# Patient Record
Sex: Female | Born: 1975 | Race: White | Hispanic: No | Marital: Married | Smoking: Never smoker
Health system: Southern US, Community
[De-identification: ages and names within clinical notes are randomized; demographics above are authoritative.]

## PROBLEM LIST (undated history)

## (undated) ENCOUNTER — Inpatient Hospital Stay (HOSPITAL_COMMUNITY): Payer: Self-pay

## (undated) DIAGNOSIS — R42 Dizziness and giddiness: Secondary | ICD-10-CM

## (undated) DIAGNOSIS — N049 Nephrotic syndrome with unspecified morphologic changes: Secondary | ICD-10-CM

## (undated) HISTORY — DX: Dizziness and giddiness: R42

## (undated) HISTORY — PX: OTHER SURGICAL HISTORY: SHX169

## (undated) HISTORY — DX: Nephrotic syndrome with unspecified morphologic changes: N04.9

## (undated) HISTORY — PX: WISDOM TOOTH EXTRACTION: SHX21

---

## 2004-11-11 ENCOUNTER — Observation Stay (HOSPITAL_COMMUNITY): Admission: AD | Admit: 2004-11-11 | Discharge: 2004-11-12 | Payer: Self-pay | Admitting: *Deleted

## 2004-11-11 ENCOUNTER — Ambulatory Visit: Payer: Self-pay | Admitting: *Deleted

## 2007-05-25 ENCOUNTER — Ambulatory Visit (HOSPITAL_COMMUNITY): Admission: RE | Admit: 2007-05-25 | Discharge: 2007-05-25 | Payer: Self-pay | Admitting: Obstetrics and Gynecology

## 2007-07-11 ENCOUNTER — Inpatient Hospital Stay (HOSPITAL_COMMUNITY): Admission: AD | Admit: 2007-07-11 | Discharge: 2007-07-11 | Payer: Self-pay | Admitting: Obstetrics and Gynecology

## 2007-08-07 ENCOUNTER — Inpatient Hospital Stay (HOSPITAL_COMMUNITY): Admission: AD | Admit: 2007-08-07 | Discharge: 2007-08-10 | Payer: Self-pay | Admitting: Obstetrics and Gynecology

## 2008-07-04 ENCOUNTER — Encounter: Admission: RE | Admit: 2008-07-04 | Discharge: 2008-07-04 | Payer: Self-pay | Admitting: Obstetrics and Gynecology

## 2009-03-14 ENCOUNTER — Inpatient Hospital Stay (HOSPITAL_COMMUNITY): Admission: AD | Admit: 2009-03-14 | Discharge: 2009-03-16 | Payer: Self-pay | Admitting: Obstetrics and Gynecology

## 2009-04-19 ENCOUNTER — Ambulatory Visit: Payer: Self-pay | Admitting: Family Medicine

## 2009-04-19 DIAGNOSIS — N049 Nephrotic syndrome with unspecified morphologic changes: Secondary | ICD-10-CM | POA: Insufficient documentation

## 2009-04-19 HISTORY — DX: Nephrotic syndrome with unspecified morphologic changes: N04.9

## 2009-08-14 ENCOUNTER — Ambulatory Visit: Payer: Self-pay | Admitting: Family Medicine

## 2009-09-13 ENCOUNTER — Ambulatory Visit: Payer: Self-pay | Admitting: Family Medicine

## 2009-09-25 ENCOUNTER — Telehealth: Payer: Self-pay | Admitting: Family Medicine

## 2010-03-07 ENCOUNTER — Telehealth: Payer: Self-pay | Admitting: Family Medicine

## 2010-03-07 ENCOUNTER — Ambulatory Visit: Payer: Self-pay | Admitting: Family Medicine

## 2010-03-07 ENCOUNTER — Encounter: Payer: Self-pay | Admitting: Family Medicine

## 2010-03-07 DIAGNOSIS — R42 Dizziness and giddiness: Secondary | ICD-10-CM

## 2010-03-07 HISTORY — DX: Dizziness and giddiness: R42

## 2010-03-12 ENCOUNTER — Ambulatory Visit: Payer: Self-pay

## 2010-03-12 ENCOUNTER — Telehealth (INDEPENDENT_AMBULATORY_CARE_PROVIDER_SITE_OTHER): Payer: Self-pay | Admitting: *Deleted

## 2010-07-16 NOTE — Progress Notes (Signed)
Summary: Event monitor  Phone Note Outgoing Call Call back at [facility] Phone [phone]   Call placed by: Stanton Kidney, EMT-P,  March 12, 2010 11:10 AM Summary of Call: [facility] for Pt. to call back to schedule for or mail event monitor. Stanton Kidney, EMT-P  March 12, 2010 11:10 AM   Follow-up for Phone Call        Pt scheduled 03/12/10 @ 2:00 for event monitor. Stanton Kidney, EMT-P  March 12, 2010 12:03 PM

## 2010-07-16 NOTE — Progress Notes (Signed)
Summary: Questions about referrals, answered  Phone Note Call from Patient Call back at Home Phone [phone]   Caller: Patient Call For: Evelena Peat MD Summary of Call: Please call pt about her event monitor.  Did not get to talk to [place] before she left the office.  Needs info on Cardiology/Neurology referral.??? Did not get enough information.  Initial call taken by: Lynann Beaver CMA,  March 07, 2010 3:33 PM  Follow-up for Phone Call        questions answered. Follow-up by: Evelena Peat MD,  March 07, 2010 5:52 PM

## 2010-07-16 NOTE — Assessment & Plan Note (Signed)
Summary: DIZZINESS (HYPOTENSION CONCERNS) // RS   Vital Signs:  Patient profile:   35 year old female Weight:      138 pounds Temp:     98.9 degrees F oral BP sitting:   118 / 80  (left arm) Cuff size:   regular  Vitals Entered By: Sid Falcon LPN (March 07, 2010 1:49 PM)  History of Present Illness: Patient here with chief complaint of dizziness.  She relates some issues of lightheadedness since 15 years. Over the past month she's had episodes (estimated #5) where she feels "out of sorts" and possibly some transient cognitive impairment. She denies any syncope.   Episodes sometimes followed by nausea and even vomiting. She has a sense of dj vu occ preceding these episodes. Denies any headaches or any focal weakness.   Her husband has observed, for example, that she will be speaking and  lose communication abilities for several seconds and then forget what she was saying.  No prior hx of seizures.  Occ sense of palpitations.  Has not taken pulse during episodes. No orthostatic symptoms.  Allergies: No Known Drug Allergies  Past History:  Past Medical History: Last updated: 04/19/2009 Nephrotic Syndrome  Past Surgical History: Last updated: 04/19/2009 none   Family History: Last updated: 04/19/2009 CAD-no HTN-no DM-no STROKE-no COLON CA-no BREAST CA-no  Social History: Last updated: 04/19/2009 Married Never Smoked Alcohol use-no Drug use-no  Risk Factors: Smoking Status: never (04/19/2009) PMH-FH-SH reviewed for relevance  Review of Systems  The patient denies anorexia, fever, weight loss, vision loss, decreased hearing, chest pain, syncope, dyspnea on exertion, peripheral edema, headaches, muscle weakness, and difficulty walking.    Physical Exam  General:  Well-developed,well-nourished,in no acute distress; alert,appropriate and cooperative throughout examination Eyes:  No corneal or conjunctival inflammation noted. EOMI. Perrla. Funduscopic exam  benign, without hemorrhages, exudates or papilledema. Vision grossly normal. Mouth:  Oral mucosa and oropharynx without lesions or exudates.  Teeth in good repair. Neck:  No deformities, masses, or tenderness noted. Lungs:  Normal respiratory effort, chest expands symmetrically. Lungs are clear to auscultation, no crackles or wheezes. Heart:  Normal rate and regular rhythm. S1 and S2 normal without gallop, murmur, click, rub or other extra sounds. Extremities:  No clubbing, cyanosis, edema, or deformity noted with normal full range of motion of all joints.   Neurologic:  alert & oriented X3, cranial nerves II-XII intact, strength normal in all extremities, sensation intact to light touch, and gait normal.     Impression & Recommendations:  Problem # 1:  DIZZINESS (ICD-780.4) Assessment New EKG unremarkable.  ?seizure activity with ? of sense of assoc Deja' vu.  Will go ahead with neurology referral and set up event monitor. Orders: EKG w/ Interpretation (93000) Cardiology Referral (Cardiology) Neurology Referral (Neuro)  Other Orders: Admin 1st Vaccine (16109) Flu Vaccine 77yrs + (60454)  Patient Instructions: 1)  Drink plenty of fluids. 2)  I recommend setting up event monitor. 3)  We may need to consider neurology referral.    Flu Vaccine Consent Questions     Do you have a history of severe allergic reactions to this vaccine? no    Any prior history of allergic reactions to egg and/or gelatin? no    Do you have a sensitivity to the preservative Thimersol? no    Do you have a past history of Guillan-Barre Syndrome? no    Do you currently have an acute febrile illness? no    Have you ever had a severe reaction  to latex? no    Vaccine information given and explained to patient? yes    Are you currently pregnant? no    Lot Number:[id number]   Exp Date:12/14/2010   Site Given  Left Deltoid IMflu

## 2010-07-16 NOTE — Progress Notes (Signed)
Summary: pinkeye ongoing ?  Phone Note Call from Patient Call back at Home Phone [phone]   Caller: Patient----voicemail Reason for Call: Talk to Doctor Summary of Call: Still has pinkeye. Wants [place] to return call. Initial call taken by: Warnell Forester,  September 25, 2009 10:47 AM  Follow-up for Phone Call        Pt reports she has been the new drops for her eyes, Tobramycin 0.3%, tow drops every 4 hours for 5 days.  She started taking it last Friday and her eyes felt and looked better until Monday night, last night pink again and today red and crusted over again.  Pt questioning need Opthalmologist evaluation? Spoke with pt.  She will continue Tobramycin for another few days and if no better consider quinolone Ab such as Zymar or Vigamox. Follow-up by: Evelena Peat MD,  September 25, 2009 1:33 PM

## 2010-07-16 NOTE — Assessment & Plan Note (Signed)
Summary: COUGH, CONGESTION // RS   Vital Signs:  Patient profile:   35 year old female Temp:     98.6 degrees F oral BP sitting:   110 / 72  (left arm) Cuff size:   regular  Vitals Entered By: Sid Falcon LPN (August 14, 1608 9:17 AM) CC: cough   History of Present Illness: Acute visit. Patient had typical flulike illness last week with fever, body aches, nasal congestion, and cough. Fever has resolved. She now has persistent cough and recent onset past couple days of bilateral maxillary facial pain yellowish nasal discharge. No recurrent fever. Increasing malaise past couple of days  Allergies (verified): No Known Drug Allergies  Past History:  Past Medical History: Last updated: 04/19/2009 Nephrotic Syndrome  Social History: Last updated: 04/19/2009 Married Never Smoked Alcohol use-no Drug use-no PMH reviewed for relevance, PSH reviewed for relevance  Review of Systems      See HPI  Physical Exam  General:  Well-developed,well-nourished,in no acute distress; alert,appropriate and cooperative throughout examination Ears:  External ear exam shows no significant lesions or deformities.  Otoscopic examination reveals clear canals, tympanic membranes are intact bilaterally without bulging, retraction, inflammation or discharge. Hearing is grossly normal bilaterally. Nose:  erythematous mucosa otherwise normal Mouth:  Oral mucosa and oropharynx without lesions or exudates.  Teeth in good repair. Neck:  No deformities, masses, or tenderness noted. Lungs:  Normal respiratory effort, chest expands symmetrically. Lungs are clear to auscultation, no crackles or wheezes. Heart:  Normal rate and regular rhythm. S1 and S2 normal without gallop, murmur, click, rub or other extra sounds.   Impression & Recommendations:  Problem # 1:  SINUSITIS, ACUTE (ICD-461.9)  Her updated medication list for this problem includes:    Amoxicillin 875 Mg Tabs (Amoxicillin) ..... One by mouth  two times a day for 10 days.  Complete Medication List: 1)  Amoxicillin 875 Mg Tabs (Amoxicillin) .... One by mouth two times a day for 10 days.  Patient Instructions: 1)  Acute sinusitis symptoms for less than 10 days are not helped by antibiotics. Use warm moist compresses, and over the counter decongestants( only as directed). Call if no improvement in 5-7 days, sooner if increasing pain, fever, or new symptoms.  Prescriptions: AMOXICILLIN 875 MG TABS (AMOXICILLIN) one by mouth two times a day for 10 days.  #20 x 0   Entered and Authorized by:   Evelena Peat MD   Signed by:   Evelena Peat MD on 08/14/2009   Method used:   Electron[facility] to        [facility]  [address] [phone]* (retail)       [address]       [place], [place]  [address]       Ph: [phone]       Fax: [phone]   RxID:   [id number]

## 2010-07-16 NOTE — Assessment & Plan Note (Signed)
Summary: ? pinkeye---strep//ccm   Vital Signs:  Patient profile:   35 year old female BP sitting:   110 / 70  (left arm) Cuff size:   regular  Vitals Entered By: Sid Falcon LPN (September 13, 2009 1:25 PM) CC: cough, congestion, sore throat, R > L pink eye   History of Present Illness: Acute visit. One-day history of right eye redness and drainage and matting. No visual difficulties and no eye pain. No injury. Also has had few days of nonproductive cough and nasal congestion and mild sore throat. No fever. No ill contacts. No known drug allergies.  Allergies (verified): No Known Drug Allergies  Review of Systems      See HPI  Physical Exam  General:  Well-developed,well-nourished,in no acute distress; alert,appropriate and cooperative throughout examination Eyes:  right is erythematous especially conjunctiva. No obvious purulent drainage at this time. Pupils equal round reactive to light. Cornea appears normal. Left appears normal Ears:  External ear exam shows no significant lesions or deformities.  Otoscopic examination reveals clear canals, tympanic membranes are intact bilaterally without bulging, retraction, inflammation or discharge. Hearing is grossly normal bilaterally. Mouth:  Oral mucosa and oropharynx without lesions or exudates.  Teeth in good repair. Neck:  No deformities, masses, or tenderness noted. Lungs:  Normal respiratory effort, chest expands symmetrically. Lungs are clear to auscultation, no crackles or wheezes. Heart:  Normal rate and regular rhythm. S1 and S2 normal without gallop, murmur, click, rub or other extra sounds.   Impression & Recommendations:  Problem # 1:  CONJUNCTIVITIS, ACUTE, RIGHT (ICD-372.00)  Her updated medication list for this problem includes:    Polytrim 10000-0.1 Unit/ml-% Soln (Polymyxin b-trimethoprim) .Marland Kitchen... 2 drops od q 4 hours while awake for 5 days  Complete Medication List: 1)  Amoxicillin 875 Mg Tabs (Amoxicillin) .... One  by mouth two times a day for 10 days. 2)  Polytrim 10000-0.1 Unit/ml-% Soln (Polymyxin b-trimethoprim) .... 2 drops od q 4 hours while awake for 5 days  Patient Instructions: 1)  Clean any discharge from eyelids with baby shampoo and warm water. Be sure to wash hands often to avoid spreading and reinfection. If you wear contacts, remove them and wear glasses until infection resolved( be sure and clean lenses before replacing).  Prescriptions: POLYTRIM 10000-0.1 UNIT/ML-% SOLN (POLYMYXIN B-TRIMETHOPRIM) 2 drops OD q 4 hours while awake for 5 days  #88ml x 1   Entered and Authorized by:   Evelena Peat MD   Signed by:   Evelena Peat MD on 09/13/2009   Method used:   [facility]        [facility]  [address] [phone]* (retail)       [address]       [place], [place]  [address]       Ph: [phone]       Fax: [phone]   RxID:   [id number]

## 2010-09-20 LAB — CBC
MCHC: 34.1 g/dL (ref 30.0–36.0)
Platelets: 199 10*3/uL (ref 150–400)
RBC: 3.82 MIL/uL — ABNORMAL LOW (ref 3.87–5.11)
RDW: 14.6 % (ref 11.5–15.5)
WBC: 10.4 10*3/uL (ref 4.0–10.5)

## 2010-09-20 LAB — RPR: RPR Ser Ql: NONREACTIVE

## 2010-11-01 NOTE — Discharge Summary (Signed)
NAMETHEDA, PAYER               ACCOUNT NO.:  [id number]   MEDICAL RECORD NO.:  [id number]          PATIENT TYPE:  INP   LOCATION:  9151                          FACILITY:  [facility]   PHYSICIAN:  Conni Elliot, M.D.DATE OF BIRTH:  [birth year]   DATE OF ADMISSION:  11/11/2004  DATE OF DISCHARGE:                                 DISCHARGE SUMMARY   HOSPITAL COURSE:  This is a 35 year old G2 P1-0-0-1 who presented at 39 and  six-sevenths weeks with nausea, vomiting, and diarrhea and abdominal  cramping. The patient was admitted to rule out preterm labor and for  rehydration secondary to viral GI illness. The patient had been traveling,  received a call from a relative who had also contracted some sort of GI bug  also with nausea and vomiting. The patient admitted for observation. Diet  was advanced slowly as the patient tolerated. The patient's diarrhea and  nausea resolved during her stay. The patient is able to tolerate diet of  clear, soft, and solids.   DISCHARGE CONDITION:  Stable.   Discharged to home. The patient moving to [place] today or tomorrow. The  patient encouraged to set up primary care there and the patient warned to  stay hydrated and to return if she is having any cramping, contractions,  nausea, vomiting, or diarrhea. The strip was reactive with baseline in the  140s upon discharge.      MBV/MEDQ  D:  11/12/2004  T:  11/12/2004  Job:  [id number]

## 2010-12-24 ENCOUNTER — Encounter: Payer: Self-pay | Admitting: Cardiology

## 2010-12-25 ENCOUNTER — Ambulatory Visit (INDEPENDENT_AMBULATORY_CARE_PROVIDER_SITE_OTHER): Payer: 59 | Admitting: Family Medicine

## 2010-12-25 ENCOUNTER — Encounter: Payer: Self-pay | Admitting: Family Medicine

## 2010-12-25 VITALS — BP 110/70 | Temp 98.4°F | Resp 12 | Wt 140.0 lb

## 2010-12-25 DIAGNOSIS — M545 Low back pain, unspecified: Secondary | ICD-10-CM

## 2010-12-25 DIAGNOSIS — M549 Dorsalgia, unspecified: Secondary | ICD-10-CM

## 2010-12-25 LAB — POCT URINALYSIS DIPSTICK
Bilirubin, UA: NEGATIVE
Glucose, UA: NEGATIVE
Leukocytes, UA: NEGATIVE
Nitrite, UA: NEGATIVE

## 2010-12-25 MED ORDER — HYDROCODONE-ACETAMINOPHEN 5-325 MG PO TABS: 2.0000 | ORAL_TABLET | Freq: Four times a day (QID) | ORAL | Status: AC | PRN

## 2010-12-25 NOTE — Patient Instructions (Signed)
Follow up promptly for any fever or worsening pain. 

## 2010-12-25 NOTE — Progress Notes (Signed)
  Subjective:    Patient ID: [name], female    DOB: 08/09/1975, 35 y.o.   MRN: [id number]  HPI Onset around noon today of severe mid low back pain. No known injury. Does lift her children frequently. Pain described as sharp and worse with movement. No associated urinary symptoms such as frequency or burning. No numbness or weakness. No urine incontinence. Take ibuprofen 600 mg without relief. Also try icing with minimal relief. Had similar episode about one month ago that eventually resolved after couple days. Pain worse with back flexion or extension. Minimal radiation anteriorly bilaterally and somewhat toward buttocks bilaterally. No localizing abdominal pain  Denies history of kidney stones. Last menstrual period about 2 weeks ago and normal   Review of Systems  Constitutional: Negative for fever, chills, activity change and appetite change.  Respiratory: Negative for cough and shortness of breath.   Cardiovascular: Negative for leg swelling.  Gastrointestinal: Negative for abdominal pain, blood in stool and abdominal distention.  Genitourinary: Negative for dysuria, urgency, hematuria, flank pain, vaginal bleeding and pelvic pain.  Musculoskeletal: Positive for back pain.  Neurological: Negative for dizziness, weakness and numbness.  Hematological: Negative for adenopathy. Does not bruise/bleed easily.       Objective:   Physical Exam  Constitutional: She appears well-developed and well-nourished.  Cardiovascular: Normal rate and regular rhythm.   Pulmonary/Chest: Effort normal and breath sounds normal. No respiratory distress. She has no wheezes. She has no rales.  Abdominal: Soft. She exhibits no distension and no mass. There is no tenderness. There is no rebound and no guarding.  Musculoskeletal: She exhibits no edema.       Straight leg raise is negative  Neurological: She has normal strength. No sensory deficit. She exhibits normal muscle tone.  Reflex Scores:  Patellar reflexes are 2+ on the right side and 2+ on the left side.      Achilles reflexes are 2+ on the right side and 2+ on the left side.         Assessment & Plan:  Low back pain. This sounds more musculoskeletal. Urine dipstick reveals only trace blood otherwise negative. No evidence for infectious etiology. Nonfocal neurologic exam. Continue ice for symptomatic relief and ibuprofen. Limited Vicodin 5/325 mg one to 2 every 6 hours as needed for severe pain. Follow up promptly for any worsening pain, fever or any new symptoms

## 2010-12-27 ENCOUNTER — Ambulatory Visit: Payer: 59 | Admitting: Family Medicine

## 2010-12-27 ENCOUNTER — Telehealth: Payer: Self-pay | Admitting: *Deleted

## 2010-12-27 NOTE — Telephone Encounter (Signed)
Give this through the weekend.  If no better by then be in touch.

## 2010-12-27 NOTE — Telephone Encounter (Signed)
PC from pt reporting ongoing intermittent sharp back pain.  She does have a 4pm appt today, wondering if she should come in or if Dr Caryl Never had something else in mind like an x-ray?

## 2010-12-27 NOTE — Telephone Encounter (Signed)
Pt informed and we will cancel the appt today.  She took 2 pain pills yesterday morning and it made her really dizzy, had to lie down.  Recommended she use on one tab during the day, husband is home helping with the children.  If pain continues through the weekend, let us know.  Pt voiced her understanding, no x-ray at this time per Dr Caryl Never

## 2010-12-30 ENCOUNTER — Telehealth: Payer: Self-pay | Admitting: *Deleted

## 2010-12-30 DIAGNOSIS — M545 Low back pain: Secondary | ICD-10-CM

## 2010-12-30 NOTE — Telephone Encounter (Signed)
Some ongoing back pain. Symptoms actually slightly improved compared to last week. She's had some ongoing back pains intermittently for several years. No injury. No radiculopathy symptoms. Symptoms worse sitting. Sharp pain improved with pain medication. No numbness or weakness. No incontinence. Would recommend a trial of physical therapy if not improving over the next few weeks consider x-rays.

## 2010-12-30 NOTE — Telephone Encounter (Signed)
Addended by: Kristian Covey on: 12/30/2010 05:58 PM   Modules accepted: Orders

## 2010-12-30 NOTE — Telephone Encounter (Signed)
Pt is calling back to let Dr. Caryl Never know she is still having back pain, and is ready for her referral to a specialist.

## 2011-01-02 ENCOUNTER — Ambulatory Visit: Payer: 59 | Attending: Family Medicine

## 2011-01-02 DIAGNOSIS — M545 Low back pain, unspecified: Secondary | ICD-10-CM | POA: Insufficient documentation

## 2011-01-02 DIAGNOSIS — M2569 Stiffness of other specified joint, not elsewhere classified: Secondary | ICD-10-CM | POA: Insufficient documentation

## 2011-01-02 DIAGNOSIS — IMO0001 Reserved for inherently not codable concepts without codable children: Secondary | ICD-10-CM | POA: Insufficient documentation

## 2011-01-02 DIAGNOSIS — R5381 Other malaise: Secondary | ICD-10-CM | POA: Insufficient documentation

## 2011-01-08 ENCOUNTER — Ambulatory Visit: Payer: 59

## 2011-01-13 ENCOUNTER — Telehealth: Payer: Self-pay | Admitting: Family Medicine

## 2011-01-13 ENCOUNTER — Other Ambulatory Visit: Payer: Self-pay | Admitting: *Deleted

## 2011-01-13 MED ORDER — HYDROCODONE-ACETAMINOPHEN 5-325 MG PO TABS: 1.0000 | ORAL_TABLET | Freq: Four times a day (QID) | ORAL | Status: AC | PRN

## 2011-01-13 NOTE — Telephone Encounter (Signed)
May refill once. 

## 2011-01-13 NOTE — Telephone Encounter (Signed)
rx called in

## 2011-01-13 NOTE — Telephone Encounter (Signed)
Refill [facility] to [facility]

## 2011-01-13 NOTE — Telephone Encounter (Signed)
Faxed refill request for hydrocodone 5-325, may take 2 tabs every 6 hours prn pain Last written 7/11, #30 with 0 refills

## 2011-01-14 ENCOUNTER — Ambulatory Visit: Payer: 59

## 2011-01-16 ENCOUNTER — Ambulatory Visit: Payer: 59 | Attending: Family Medicine

## 2011-01-16 DIAGNOSIS — R5381 Other malaise: Secondary | ICD-10-CM | POA: Insufficient documentation

## 2011-01-16 DIAGNOSIS — M545 Low back pain, unspecified: Secondary | ICD-10-CM | POA: Insufficient documentation

## 2011-01-16 DIAGNOSIS — M2569 Stiffness of other specified joint, not elsewhere classified: Secondary | ICD-10-CM | POA: Insufficient documentation

## 2011-01-16 DIAGNOSIS — IMO0001 Reserved for inherently not codable concepts without codable children: Secondary | ICD-10-CM | POA: Insufficient documentation

## 2011-01-21 ENCOUNTER — Ambulatory Visit: Payer: 59

## 2011-01-22 ENCOUNTER — Ambulatory Visit: Payer: 59 | Admitting: Physical Therapy

## 2011-01-28 ENCOUNTER — Ambulatory Visit: Payer: 59

## 2011-01-28 ENCOUNTER — Telehealth: Payer: Self-pay | Admitting: Family Medicine

## 2011-01-28 NOTE — Telephone Encounter (Signed)
Please ask Dr. Caryl Never

## 2011-01-28 NOTE — Telephone Encounter (Signed)
We need to get records from PT (physical therapy) and should probably go ahead and have her schedule follow up to reassess if no better after PT

## 2011-01-28 NOTE — Telephone Encounter (Signed)
Saw a physical therapist and was told there that she needed to be referred to someone else. They will be faxing a correspondence relating to the issue.

## 2011-01-30 ENCOUNTER — Other Ambulatory Visit: Payer: Self-pay | Admitting: Obstetrics and Gynecology

## 2011-01-30 ENCOUNTER — Ambulatory Visit: Payer: 59

## 2011-01-30 DIAGNOSIS — N6009 Solitary cyst of unspecified breast: Secondary | ICD-10-CM

## 2011-02-03 ENCOUNTER — Telehealth: Payer: Self-pay | Admitting: Family Medicine

## 2011-02-03 NOTE — Telephone Encounter (Signed)
Pt called and said that she went to physical therapy and the therapist faxed over pts records. Pt is needing to get a referral to a specialist that could do an MRI or Xray. Pls advise.

## 2011-02-03 NOTE — Telephone Encounter (Signed)
Please advise 

## 2011-02-04 NOTE — Telephone Encounter (Signed)
Please help schedule pt

## 2011-02-04 NOTE — Telephone Encounter (Signed)
We can get some x-rays.  I recommend reassess her so we can determine best type of x-ray.

## 2011-02-04 NOTE — Telephone Encounter (Signed)
Pt has been called and sch for ov for 02/05/11 at 2pm  to re-assess re: physical therapy as noted.

## 2011-02-05 ENCOUNTER — Ambulatory Visit (INDEPENDENT_AMBULATORY_CARE_PROVIDER_SITE_OTHER): Payer: 59 | Admitting: Family Medicine

## 2011-02-05 ENCOUNTER — Encounter: Payer: Self-pay | Admitting: Family Medicine

## 2011-02-05 ENCOUNTER — Ambulatory Visit
Admission: RE | Admit: 2011-02-05 | Discharge: 2011-02-05 | Disposition: A | Discharge status: Home / Self Care | Payer: 59 | Source: Ambulatory Visit | Attending: Obstetrics and Gynecology | Admitting: Obstetrics and Gynecology

## 2011-02-05 VITALS — BP 100/60 | Temp 98.6°F | Wt 140.0 lb

## 2011-02-05 DIAGNOSIS — N6009 Solitary cyst of unspecified breast: Secondary | ICD-10-CM

## 2011-02-05 DIAGNOSIS — M545 Low back pain: Secondary | ICD-10-CM

## 2011-02-05 MED ORDER — CYCLOBENZAPRINE HCL 5 MG PO TABS: 5.0000 mg | ORAL_TABLET | Freq: Three times a day (TID) | ORAL | Status: AC | PRN

## 2011-02-05 NOTE — Progress Notes (Signed)
  Subjective:    Patient ID: [name], female    DOB: [birth year], 35 y.o.   MRN: [id number]  HPI Persistent back pain. Onset around early July. Patient initially had pain lower thoracic and upper lumbar lumbar area.  Tried anti-inflammatories, heat and ice without much improvement. Did get some relief with hydrocodone. She has recently finished physical therapy and had some improvement in mobility but not much improvement pain.  Pain is worse sitting. It radiates somewhat bilateral buttocks. Dull to sharp. No radiculopathy symptoms. Denies weakness. No numbness. No incontinence. Denies any fever, chills, urinary symptoms, abdominal pain,  Appetite. or weight changes  no x-rays have been done to this point.  Pain is moderate.  She has 4 children and does frequently lift her 7-year-old son.   Review of Systems  Constitutional: Negative for fever, chills, appetite change and unexpected weight change.  Respiratory: Negative for shortness of breath.   Cardiovascular: Negative for chest pain.  Gastrointestinal: Negative for abdominal pain.  Genitourinary: Negative for dysuria and hematuria.  Musculoskeletal: Positive for back pain. Negative for myalgias and gait problem.  Skin: Negative for rash.  Neurological: Negative for weakness and numbness.  Hematological: Negative for adenopathy. Does not bruise/bleed easily.  Psychiatric/Behavioral: Negative for dysphoric mood.       Objective:   Physical Exam  Constitutional: She appears well-developed and well-nourished. No distress.  Cardiovascular: Normal rate, regular rhythm and normal heart sounds.   No murmur heard. Pulmonary/Chest: Effort normal and breath sounds normal. No respiratory distress. She has no wheezes. She has no rales.  Musculoskeletal:       No spinal tenderness. Straight leg raise is negative. Good range of motion lumbar spine with flexion and extension  Neurological:       Symmetric reflexes in knee and ankle. No  sensory impairment. Full-strength lower extremities. No muscle atrophy  Psychiatric: She has a normal mood and affect. Her behavior is normal.          Assessment & Plan:  Persistent lumbar back pain with nonfocal neurologic exam.  Not improved with physical therapy .Given duration of symptoms lumbosacral spine films. Trial of low-dose cyclobenzaprine 5 mg each bedtime. Continue stretches and walking as tolerated.  Consider MRI if no better in 2 weeks.

## 2011-03-06 LAB — CBC
MCHC: 35.9
RBC: 3.47 — ABNORMAL LOW
WBC: 10.6 — ABNORMAL HIGH

## 2011-03-06 LAB — DIFFERENTIAL
Basophils Relative: 0
Monocytes Relative: 7
Neutro Abs: 8.3 — ABNORMAL HIGH
Neutrophils Relative %: 79 — ABNORMAL HIGH

## 2011-03-06 LAB — URINALYSIS, ROUTINE W REFLEX MICROSCOPIC
Bilirubin Urine: NEGATIVE
Nitrite: NEGATIVE
Specific Gravity, Urine: >1.03 — ABNORMAL HIGH
Urobilinogen, UA: 0.2

## 2011-03-07 LAB — CBC
HCT: 33.3 — ABNORMAL LOW
Hemoglobin: 11.6 — ABNORMAL LOW
Hemoglobin: 12.6
MCHC: 35.6
MCV: 92.7
Platelets: 210
RBC: 3.6 — ABNORMAL LOW
RBC: 3.88
WBC: 10.7 — ABNORMAL HIGH
WBC: 8.9

## 2011-03-07 LAB — RH IMMUNE GLOB WKUP(>/=20WKS)(NOT WOMEN'S HOSP)

## 2011-03-24 LAB — RH IMMUNE GLOBULIN WORKUP (NOT WOMEN'S HOSP)
ABO/RH(D): A NEG
Antibody Screen: NEGATIVE

## 2011-03-24 LAB — GLUCOSE TOLERANCE, 1 HOUR: Glucose, 1 Hour GTT: 75

## 2011-05-16 LAB — RUBELLA ANTIBODY, IGM: Rubella: IMMUNE

## 2011-05-16 LAB — HIV ANTIBODY (ROUTINE TESTING W REFLEX): HIV: NONREACTIVE

## 2011-06-09 ENCOUNTER — Encounter: Payer: Self-pay | Admitting: Family Medicine

## 2011-06-09 ENCOUNTER — Ambulatory Visit (INDEPENDENT_AMBULATORY_CARE_PROVIDER_SITE_OTHER): Payer: 59 | Admitting: Family Medicine

## 2011-06-09 VITALS — BP 112/78 | Temp 98.8°F | Wt 150.0 lb

## 2011-06-09 DIAGNOSIS — J329 Chronic sinusitis, unspecified: Secondary | ICD-10-CM

## 2011-06-09 MED ORDER — AMOXICILLIN 875 MG PO TABS: 875.0000 mg | ORAL_TABLET | Freq: Two times a day (BID) | ORAL | Status: AC

## 2011-06-09 NOTE — Progress Notes (Signed)
  Subjective:    Patient ID: [name], female    DOB: 07/24/1975, 35 y.o.   MRN: [id number]  HPI  Bilateral ear pressure. Patient relates almost two-week history of nasal congestion. Some postnasal drainage. Right maxillary and frontal sinus pressure. Mild very transient vertigo yesterday but none today. No nausea or vomiting. Intermittent sore throat. Rare dry cough. No fever but occasional chills. Increased malaise. Symptoms seem to be worsening. Tried low-dose Sudafed without much improvement. Patient is 3 months pregnant. No vaginal spotting. No pregnancy complications.   Review of Systems As per history of present illness    Objective:   Physical Exam  Constitutional: She appears well-developed and well-nourished.  HENT:  Right Ear: External ear normal.  Left Ear: External ear normal.  Mouth/Throat: Oropharynx is clear and moist.       Erythematous nasal mucosa otherwise normal  Neck: Neck supple.  Cardiovascular: Normal rate and regular rhythm.   Pulmonary/Chest: Effort normal and breath sounds normal. No respiratory distress. She has no wheezes. She has no rales.  Lymphadenopathy:    She has no cervical adenopathy.          Assessment & Plan:  Acute sinusitis. Amoxicillin 875 mg twice a day for 10 days. Continue low-dose Sudafed as needed.

## 2011-06-09 NOTE — Patient Instructions (Signed)

## 2011-06-18 ENCOUNTER — Other Ambulatory Visit: Payer: Self-pay

## 2011-08-26 ENCOUNTER — Ambulatory Visit (INDEPENDENT_AMBULATORY_CARE_PROVIDER_SITE_OTHER): Payer: 59 | Admitting: Cardiology

## 2011-08-26 ENCOUNTER — Encounter: Payer: Self-pay | Admitting: Cardiology

## 2011-08-26 DIAGNOSIS — R002 Palpitations: Secondary | ICD-10-CM

## 2011-08-26 DIAGNOSIS — R42 Dizziness and giddiness: Secondary | ICD-10-CM

## 2011-08-26 DIAGNOSIS — R079 Chest pain, unspecified: Secondary | ICD-10-CM | POA: Insufficient documentation

## 2011-08-26 LAB — BASIC METABOLIC PANEL
CO2: 28 mEq/L (ref 19–32)
Chloride: 102 mEq/L (ref 96–112)
Glucose, Bld: 81 mg/dL (ref 70–99)
Potassium: 3.8 mEq/L (ref 3.5–5.1)
Sodium: 136 mEq/L (ref 135–145)

## 2011-08-26 LAB — TSH: TSH: 1.71 u[IU]/mL (ref 0.35–5.50)

## 2011-08-26 NOTE — Progress Notes (Signed)
   HPI The patient presents for evaluation of presyncope. She is 6 months pregnant. The other day in the shower he became short of breath. She became diaphoretic. She felt like there might have been out and sitting on her chest. Her hands began to tingle. She was presyncopal. She did not have frank syncopal. She does not recall whether her heart was racing or skipping. She had been feeling well prior to this. She had not been having any difficulty with her pregnancy. All of her symptoms lasted for about 12 minutes before resolving. She has had one syncopal episode many years ago as a child. She has however had long-standing occasional episodes of feeling like "dj vu" with some dizziness and lightheadedness. She does occasionally feel palpitations. This may be happening more and probably daily. She doesn't describe chest pressure other than above. She's not having any neck or arm discomfort. She's not having any PND or orthopnea. She is starting to have some swelling related to her pregnancy. Her other 4 pregnancies and deliveries have been unremarkable although I do seeing some mention of placenta previa and nephrotic syndrome at age 36.  No Known Allergies  No current outpatient prescriptions on file.    Past Medical History  Diagnosis Date  . Nephrotic syndrome   . NEPHROTIC SYNDROME 04/19/2009  . DIZZINESS 03/07/2010    No past surgical history on file.  Family History  Problem Relation Age of Onset  . Diabetes Neg Hx   . Hypertension Neg Hx   . Coronary artery disease Neg Hx   . Stroke Neg Hx   . Colon cancer Neg Hx     History   Social History  . Marital Status: Married    Spouse Name: N/A    Number of Children: N/A  . Years of Education: N/A   Occupational History  . Not on file.   Social History Main Topics  . Smoking status: Never Smoker   . Smokeless tobacco: Not on file  . Alcohol Use: No  . Drug Use: No  . Sexually Active:    Other Topics Concern  . Not on  file   Social History Narrative  . No narrative on file    ROS:  Headaches, dizziness, nausea, reflux, varicose veins. Otherwise as stated in the HPI and negative for all other systems.  PHYSICAL EXAM BP 100/75  Pulse 103  Ht 5\' 2"  (1.575 m)  Wt 162 lb (73.483 kg)  BMI 29.63 kg/m2  LMP 12/11/2010 GENERAL:  Well appearing HEENT:  Pupils equal round and reactive, fundi not visualized, oral mucosa unremarkable NECK:  No jugular venous distention, waveform within normal limits, carotid upstroke brisk and symmetric, no bruits, no thyromegaly LYMPHATICS:  No cervical, inguinal adenopathy LUNGS:  Clear to auscultation bilaterally BACK:  No CVA tenderness CHEST:  Unremarkable HEART:  PMI not displaced or sustained,S1 and S2 within normal limits, no S3, no S4, no clicks, no rubs, no murmurs ABD:  Flat, positive bowel sounds normal in frequency in pitch, no bruits, no rebound, no guarding, no midline pulsatile mass, no hepatomegaly, no splenomegaly, gravid EXT:  2 plus pulses throughout, no edema, no cyanosis no clubbing SKIN:  No rashes no nodules NEURO:  Cranial nerves II through XII grossly intact, motor grossly intact throughout PSYCH:  Cognitively intact, oriented to person place and time  EKG:  Sinus tachycardia, rate 103, axis within normal limits, intervals within the, no acute ST-T wave changes.  ASSESSMENT AND PLAN

## 2011-08-26 NOTE — Patient Instructions (Signed)
Your physician has recommended that you wear a holter monitor. Holter monitors are medical devices that record the heart's electrical activity. Doctors most often use these monitors to diagnose arrhythmias. Arrhythmias are problems with the speed or rhythm of the heartbeat. The monitor is a small, portable device. You can wear one while you do your normal daily activities. This is usually used to diagnose what is causing palpitations/syncope (passing out).  Your physician has requested that you have an echocardiogram. Echocardiography is a painless test that uses sound waves to create images of your heart. It provides your doctor with information about the size and shape of your heart and how well your heart's chambers and valves are working. This procedure takes approximately one hour. There are no restrictions for this procedure.  Please have lab work today (TSH,BMP)  The current medical regimen is effective;  continue present plan and medications.   Follow up in 2 to 3 weeks with Dr Antoine Poche

## 2011-08-26 NOTE — Assessment & Plan Note (Signed)
She has otherwise not been having any symptoms. However, if the above testing is unremarkable and she continues to have symptoms I will have a low threshold for exercise treadmill testing.

## 2011-08-26 NOTE — Assessment & Plan Note (Signed)
She had the episode of dizziness as described. Had long-standing palpitations. I will start with magnesium and TSH and a basic metabolic profile. She'll get an echocardiogram and a 48 hour Holter monitor. Further evaluation will be based on these results.

## 2011-09-04 ENCOUNTER — Ambulatory Visit (HOSPITAL_COMMUNITY)
Admission: RE | Admit: 2011-09-04 | Discharge: 2011-09-04 | Disposition: A | Discharge status: Home / Self Care | Payer: 59 | Source: Ambulatory Visit | Attending: Obstetrics and Gynecology | Admitting: Obstetrics and Gynecology

## 2011-09-04 ENCOUNTER — Encounter (HOSPITAL_COMMUNITY): Payer: Self-pay

## 2011-09-04 ENCOUNTER — Other Ambulatory Visit (HOSPITAL_COMMUNITY): Payer: Self-pay | Admitting: Obstetrics and Gynecology

## 2011-09-04 DIAGNOSIS — B083 Erythema infectiosum [fifth disease]: Secondary | ICD-10-CM

## 2011-09-04 DIAGNOSIS — B9789 Other viral agents as the cause of diseases classified elsewhere: Secondary | ICD-10-CM | POA: Insufficient documentation

## 2011-09-04 DIAGNOSIS — O98519 Other viral diseases complicating pregnancy, unspecified trimester: Secondary | ICD-10-CM | POA: Insufficient documentation

## 2011-09-04 DIAGNOSIS — O2690 Pregnancy related conditions, unspecified, unspecified trimester: Secondary | ICD-10-CM

## 2011-09-04 DIAGNOSIS — O4410 Placenta previa with hemorrhage, unspecified trimester: Secondary | ICD-10-CM | POA: Insufficient documentation

## 2011-09-05 ENCOUNTER — Encounter (INDEPENDENT_AMBULATORY_CARE_PROVIDER_SITE_OTHER): Payer: 59

## 2011-09-05 ENCOUNTER — Other Ambulatory Visit: Payer: Self-pay

## 2011-09-05 ENCOUNTER — Ambulatory Visit (HOSPITAL_COMMUNITY): Payer: 59 | Attending: Internal Medicine

## 2011-09-05 DIAGNOSIS — R42 Dizziness and giddiness: Secondary | ICD-10-CM | POA: Insufficient documentation

## 2011-09-05 DIAGNOSIS — R002 Palpitations: Secondary | ICD-10-CM

## 2011-09-05 DIAGNOSIS — I2510 Atherosclerotic heart disease of native coronary artery without angina pectoris: Secondary | ICD-10-CM | POA: Insufficient documentation

## 2011-09-05 DIAGNOSIS — O99891 Other specified diseases and conditions complicating pregnancy: Secondary | ICD-10-CM | POA: Insufficient documentation

## 2011-09-07 ENCOUNTER — Inpatient Hospital Stay (HOSPITAL_COMMUNITY)
Admission: AD | Admit: 2011-09-07 | Discharge: 2011-09-16 | DRG: 781 | Disposition: A | Discharge status: Home / Self Care | Payer: 59 | Source: Ambulatory Visit | Attending: Obstetrics and Gynecology | Admitting: Obstetrics and Gynecology

## 2011-09-07 ENCOUNTER — Encounter (HOSPITAL_COMMUNITY): Payer: Self-pay | Admitting: *Deleted

## 2011-09-07 ENCOUNTER — Inpatient Hospital Stay (HOSPITAL_COMMUNITY): Payer: 59

## 2011-09-07 DIAGNOSIS — O4410 Placenta previa with hemorrhage, unspecified trimester: Secondary | ICD-10-CM | POA: Diagnosis present

## 2011-09-07 DIAGNOSIS — O321XX0 Maternal care for breech presentation, not applicable or unspecified: Secondary | ICD-10-CM | POA: Diagnosis present

## 2011-09-07 DIAGNOSIS — Z20828 Contact with and (suspected) exposure to other viral communicable diseases: Secondary | ICD-10-CM | POA: Diagnosis present

## 2011-09-07 DIAGNOSIS — O99891 Other specified diseases and conditions complicating pregnancy: Secondary | ICD-10-CM | POA: Diagnosis present

## 2011-09-07 DIAGNOSIS — O4290 Premature rupture of membranes, unspecified as to length of time between rupture and onset of labor, unspecified weeks of gestation: Principal | ICD-10-CM | POA: Diagnosis present

## 2011-09-07 DIAGNOSIS — O4400 Placenta previa specified as without hemorrhage, unspecified trimester: Secondary | ICD-10-CM

## 2011-09-07 DIAGNOSIS — B083 Erythema infectiosum [fifth disease]: Secondary | ICD-10-CM

## 2011-09-07 DIAGNOSIS — O98519 Other viral diseases complicating pregnancy, unspecified trimester: Secondary | ICD-10-CM

## 2011-09-07 LAB — CBC
HCT: 30.9 % — ABNORMAL LOW (ref 36.0–46.0)
Hemoglobin: 10.6 g/dL — ABNORMAL LOW (ref 12.0–15.0)
MCH: 31.2 pg (ref 26.0–34.0)
MCHC: 34.3 g/dL (ref 30.0–36.0)
RBC: 3.4 MIL/uL — ABNORMAL LOW (ref 3.87–5.11)

## 2011-09-07 LAB — AMNISURE RUPTURE OF MEMBRANE (ROM) NOT AT ARMC: Amnisure ROM: POSITIVE

## 2011-09-07 MED ORDER — PRENATAL MULTIVITAMIN CH: 1.0000 | ORAL_TABLET | Freq: Every day | ORAL | Status: DC

## 2011-09-07 MED ORDER — SODIUM CHLORIDE 0.9 % IJ SOLN
3.0000 mL | Freq: Two times a day (BID) | INTRAMUSCULAR | Status: DC
  Administered 2011-09-08 – 2011-09-16 (×15): 3 mL via INTRAVENOUS

## 2011-09-07 MED ORDER — ACETAMINOPHEN 325 MG PO TABS
650.0000 mg | ORAL_TABLET | ORAL | Status: DC | PRN
  Administered 2011-09-07 – 2011-09-16 (×2): 650 mg via ORAL
  Filled 2011-09-07 (×2): qty 2

## 2011-09-07 MED ORDER — SODIUM CHLORIDE 0.9 % IJ SOLN
3.0000 mL | INTRAMUSCULAR | Status: DC | PRN
  Administered 2011-09-07 – 2011-09-10 (×4): 3 mL via INTRAVENOUS

## 2011-09-07 MED ORDER — SODIUM CHLORIDE 0.9 % IV SOLN: 250.0000 mL | INTRAVENOUS | Status: DC | PRN

## 2011-09-07 MED ORDER — CALCIUM CARBONATE ANTACID 500 MG PO CHEW
2.0000 | CHEWABLE_TABLET | ORAL | Status: DC | PRN
  Filled 2011-09-07: qty 2

## 2011-09-07 MED ORDER — SODIUM CHLORIDE 0.9 % IV SOLN
2.0000 g | Freq: Four times a day (QID) | INTRAVENOUS | Status: AC
  Administered 2011-09-07 – 2011-09-09 (×8): 2 g via INTRAVENOUS
  Filled 2011-09-07 (×8): qty 2000

## 2011-09-07 MED ORDER — ERYTHROMYCIN BASE 250 MG PO TABS
250.0000 mg | ORAL_TABLET | Freq: Four times a day (QID) | ORAL | Status: AC
  Administered 2011-09-09 – 2011-09-14 (×19): 250 mg via ORAL
  Filled 2011-09-07 (×20): qty 1

## 2011-09-07 MED ORDER — DOCUSATE SODIUM 100 MG PO CAPS: 100.0000 mg | ORAL_CAPSULE | Freq: Every day | ORAL | Status: DC

## 2011-09-07 MED ORDER — TERBUTALINE SULFATE 1 MG/ML IJ SOLN
0.2500 mg | Freq: Once | INTRAMUSCULAR | Status: AC
  Administered 2011-09-07: 0.25 mg via SUBCUTANEOUS
  Filled 2011-09-07: qty 1

## 2011-09-07 MED ORDER — PRENATAL MULTIVITAMIN CH
1.0000 | ORAL_TABLET | Freq: Every day | ORAL | Status: DC
  Administered 2011-09-07 – 2011-09-16 (×11): 1 via ORAL
  Filled 2011-09-07 (×12): qty 1

## 2011-09-07 MED ORDER — DOCUSATE SODIUM 100 MG PO CAPS
100.0000 mg | ORAL_CAPSULE | Freq: Every day | ORAL | Status: DC
  Administered 2011-09-07 – 2011-09-16 (×10): 100 mg via ORAL
  Filled 2011-09-07 (×12): qty 1

## 2011-09-07 MED ORDER — ACETAMINOPHEN 325 MG PO TABS: 650.0000 mg | ORAL_TABLET | ORAL | Status: DC | PRN

## 2011-09-07 MED ORDER — ZOLPIDEM TARTRATE 10 MG PO TABS
10.0000 mg | ORAL_TABLET | Freq: Every evening | ORAL | Status: DC | PRN
  Administered 2011-09-09 – 2011-09-16 (×8): 10 mg via ORAL
  Filled 2011-09-07 (×10): qty 1

## 2011-09-07 MED ORDER — BETAMETHASONE SOD PHOS & ACET 6 (3-3) MG/ML IJ SUSP
12.0000 mg | INTRAMUSCULAR | Status: AC
  Administered 2011-09-07 – 2011-09-08 (×2): 12 mg via INTRAMUSCULAR
  Filled 2011-09-07 (×3): qty 2

## 2011-09-07 MED ORDER — CALCIUM CARBONATE ANTACID 500 MG PO CHEW: 2.0000 | CHEWABLE_TABLET | ORAL | Status: DC | PRN

## 2011-09-07 MED ORDER — ZOLPIDEM TARTRATE 10 MG PO TABS: 10.0000 mg | ORAL_TABLET | Freq: Every evening | ORAL | Status: DC | PRN

## 2011-09-07 MED ORDER — AMOXICILLIN 500 MG PO CAPS
500.0000 mg | ORAL_CAPSULE | Freq: Three times a day (TID) | ORAL | Status: AC
  Administered 2011-09-09 – 2011-09-14 (×15): 500 mg via ORAL
  Filled 2011-09-07 (×15): qty 1

## 2011-09-07 MED ORDER — SODIUM CHLORIDE 0.9 % IV SOLN
250.0000 mg | Freq: Four times a day (QID) | INTRAVENOUS | Status: AC
  Administered 2011-09-07 – 2011-09-09 (×8): 250 mg via INTRAVENOUS
  Filled 2011-09-07 (×8): qty 250

## 2011-09-07 MED ORDER — TERBUTALINE SULFATE 1 MG/ML IJ SOLN
INTRAMUSCULAR | Status: AC
  Filled 2011-09-07: qty 1

## 2011-09-07 NOTE — H&P (Signed)
[name] is a 36 y.o. female presenting with prom at 24.2 weeks.  Positive amniostat.  No ctx's.  Placenta previa by her report. Maternal Medical History:  Reason for admission: Reason for admission: rupture of membranes.  Contractions: Onset was 3-5 hours ago.   Frequency: rare.    Fetal activity: Perceived fetal activity is normal.      OB History    Grav Para Term Preterm Abortions TAB SAB Ect Mult Living   5 4 4  0 0 0 0 0 0 4     Past Medical History  Diagnosis Date  . NEPHROTIC SYNDROME 04/19/2009  . DIZZINESS 03/07/2010   Past Surgical History  Procedure Date  . None    Family History: family history includes Diabetes in her father.  There is no history of Hypertension, and Coronary artery disease, and Stroke, and Colon cancer, . Social History:  reports that she has never smoked. She does not have any smokeless tobacco history on file. She reports that she does not drink alcohol or use illicit drugs.  Review of Systems  All other systems reviewed and are negative.      Blood pressure 116/75, pulse 101, temperature 98.2 F (36.8 C), temperature source Oral, resp. rate 20, height 5\' 2"  (1.575 m), weight 75.297 kg (166 lb), last menstrual period 03/21/2011. Maternal Exam:  Uterine Assessment: Contraction strength is mild.  Contraction frequency is rare.   Abdomen: Fundal height is 24 cm.    Introitus: Amniotic fluid character: clear.     Physical Exam  Constitutional: She is oriented to person, place, and time.  Cardiovascular: Normal rate, regular rhythm and normal heart sounds.   Respiratory: Effort normal and breath sounds normal.  GI: Soft. There is no tenderness.  Musculoskeletal: She exhibits no edema.  Neurological: She is oriented to person, place, and time. She has normal reflexes.    Prenatal labs: ABO, Rh:   Antibody: Negative (11/30 0000) Rubella: Immune (11/30 0000) RPR: Nonreactive (11/30 0000)  HBsAg: Negative (11/30 0000)  HIV:  Non-reactive (11/30 0000)  GBS:     Assessment/Plan: IUP at 24.5 with prom. Antibiotics per protocol. steriods for lung maturation. Expectant management. Check sono   Kohei Antonellis S 09/07/2011, 4:06 PM

## 2011-09-07 NOTE — Progress Notes (Signed)
Dr. Arelia Sneddon notified of pt's and back pain. Orders received for sq terbutaline .25mg .

## 2011-09-07 NOTE — MAU Provider Note (Signed)
Chief Complaint:  Rupture of Membranes    First Provider Initiated Contact with Patient 09/07/11 1409      [name] is  36 y.o. [id number].  Patient's last menstrual period was 03/21/2011..  [redacted]w[redacted]d  She presents complaining of Rupture of Membranes . Onset is described as sudden and has been present for  At 12:30 pm today. Denies blding and abd pain. Reports increasing low back cramping. Pregnancy complicated by placenta previa, last seen on [place] 09/04/11.  Obstetrical/Gynecological History: OB History    Grav Para Term Preterm Abortions TAB SAB Ect Mult Living   5 4 4  0 0 0 0 0 0 4      Past Medical History: Past Medical History  Diagnosis Date  . NEPHROTIC SYNDROME 04/19/2009  . DIZZINESS 03/07/2010    Past Surgical History: Past Surgical History  Procedure Date  . None     Family History: Family History  Problem Relation Age of Onset  . Diabetes Father     Type II  . Hypertension Neg Hx   . Coronary artery disease Neg Hx   . Stroke Neg Hx   . Colon cancer Neg Hx     Social History: History  Substance Use Topics  . Smoking status: Never Smoker   . Smokeless tobacco: Not on file  . Alcohol Use: No    Allergies: No Known Allergies  Prescriptions prior to admission  Medication Sig Dispense Refill  . calcium carbonate (TUMS - DOSED IN MG ELEMENTAL CALCIUM) 500 MG chewable tablet Chew 1 tablet by mouth daily as needed. heartburn      . Prenatal Vit-Fe Fumarate-FA (PRENATAL MULTIVITAMIN) TABS Take 1 tablet by mouth daily.        Review of Systems - Negative except what has been reviewed in the HPI  Physical Exam   Blood pressure 119/69, pulse 110, temperature 98.6 F (37 C), temperature source Oral, resp. rate 20, height 5\' 2"  (1.575 m), weight 166 lb (75.297 kg), last menstrual period 03/21/2011.  General: General appearance - alert, well appearing, and in no distress and oriented to person, place, and time Mental status - alert, oriented to person, place,  and time, normal mood, behavior, speech, dress, motor activity, and thought processes, affect appropriate to mood Abdomen - gravid, non tender Focused Gynecological Exam: deferred due to previa, Amnisure collected  Labs: Recent Results (from the past 24 hour(s))  AMNISURE RUPTURE OF MEMBRANE (ROM)   Collection Time   09/07/11  1:45 PM      Component Value Range   Amnisure ROM POSITIVE     MD consult: Discussed with Dr. Arelia Sneddon. Will obtain US, give BMZ, admit to ante. Dr. Arelia Sneddon coming to see pt.  Assessment: PPROM at [redacted]w[redacted]d Left Lateral Placenta Previa  Plan: Admit to antenatal BMZ  Dr. Arelia Sneddon to see pt on antenatal and complete admit orders.  Ione Sandusky E. 09/07/2011,2:21 PM

## 2011-09-08 ENCOUNTER — Institutional Professional Consult (permissible substitution): Payer: 59 | Admitting: Cardiology

## 2011-09-08 MED ORDER — TERBUTALINE SULFATE 1 MG/ML IJ SOLN
0.2500 mg | Freq: Once | INTRAMUSCULAR | Status: AC
Start: 2011-09-08 — End: 2011-09-08
  Administered 2011-09-08: 0.25 mg via SUBCUTANEOUS
  Filled 2011-09-08: qty 1

## 2011-09-08 NOTE — Progress Notes (Signed)
UR chart review completed.  

## 2011-09-08 NOTE — Progress Notes (Signed)
24 3/7 weeks  Had a single leak of clear/yellow fluid about 11:30 am yesterday. Estimates about 4 tbsp.  None since, none today.  No blood. No fever/chills or N/V. No CNS change or epigastric pain.  Has a known placenta previa-states last bleeding episode was about 4 weeks ago.  Feels occasional UCs.  Hx of rapid maternal HR.  Recent EKG-sinus tach.  Has been wearing cardiac monitor at home and was due to turn it in today.  Blood pressure 110/51, pulse 117, temperature 98.4 F (36.9 C), temperature source Oral, resp. rate 20, height 5\' 2"  (1.575 m), weight 75.297 kg (166 lb), last menstrual period 03/21/2011.  Lungs CTA Cor RRR 90s Ut soft/NT Ext NT FHT + accels UCs irreg, mild, none since going to BR this am          S/P terbutaline x 2, last dose 5:30 am.  U/S-normal AFI       -transverse head mat left      -complete previa      -EFW  809 gm, 74%      -cx 3.8 cm  Results for orders placed during the hospital encounter of 09/07/11 (from the past 24 hour(s))  AMNISURE RUPTURE OF MEMBRANE (ROM)     Status: Normal   Collection Time   09/07/11  1:45 PM      Component Value Range   Amnisure ROM POSITIVE    CBC     Status: Abnormal   Collection Time   09/07/11  4:30 PM      Component Value Range   WBC 6.9  4.0 - 10.5 (K/uL)   RBC 3.40 (*) 3.87 - 5.11 (MIL/uL)   Hemoglobin 10.6 (*) 12.0 - 15.0 (g/dL)   HCT 78.2 (*) 95.6 - 46.0 (%)   MCV 90.9  78.0 - 100.0 (fL)   MCH 31.2  26.0 - 34.0 (pg)   MCHC 34.3  30.0 - 36.0 (g/dL)   RDW 21.3  08.6 - 57.8 (%)   Platelets 181  150 - 400 (K/uL)    A: PROM by hx and Amniosure with no further leaking-pt feels sure it was not urine , possible high leak     Complete placenta previa-no bleeding x 4 weeks     Irreg UCs, currently subsided     Maternal intermittent sinus tachycardia-currently undergoing cardiac evaluation     Parvo virus exposure this pregnancy-started weekly U/S 1 or 2 weeks ago    Rh negative, husband positive-pt will  need Rhogam if delivered, has significant bleeding episode or at 29 weeks  P: Continue BR, EFM, BMTZ, IV fluids, antibiotics per PROM protocol     Begin PAS     Neonatology consult     MFM U/S this week to follow parvo and for above     In view of prematurity, no further leaking and placenta previa, and for CP prophylaxis  would consider magnesium sulfate tocolysis if contracts     T&S     D/W patient and husband above-d/w prematurity and neonatology consult, transverse lie possibly neccesitating a classical c/s, previa increasing risks of bleeding possibly needing transfusion, need for continued BR, possible Magnesium sulfate prophylaxis. D/W cesarean section and risks including infection, bleeding/transfusion-HIV/Hep, organ damage, DVT/PE, pneumonia.  All questions answered.

## 2011-09-09 ENCOUNTER — Inpatient Hospital Stay (HOSPITAL_COMMUNITY): Payer: 59

## 2011-09-09 ENCOUNTER — Encounter (HOSPITAL_COMMUNITY): Payer: Self-pay | Admitting: *Deleted

## 2011-09-09 NOTE — Progress Notes (Signed)
Obstetric ultrasound performed today.  Amniotic fluid volume WNL.  MCA velocities reassuring.  Complete previa persists.  Please see full report in [facility].

## 2011-09-09 NOTE — Consult Note (Signed)
[facility]  Neonatal Medicine Consultation       09/09/2011    10:04 PM  I was called at the request of the patient's obstetrician (Dr. Arelia Sneddon) to speak to this patient due to premature rupture of membranes at 24 5/7 weeks.  She is now [redacted] weeks gestation.  Her pregnancy is complicated by placenta previa.  She is receiving treatment including betamethasone, antibiotics.  I reviewed expectations for preterm deliveries including survival, morbidity concerns.  Problems such as respiratory distress, IVH, feeding, retinopathy.  Expected LOS reviewed.  Mom had a number of questions which I answered.  _____________________ Electronically Signed By: Angelita Ingles, MD Neonatologist

## 2011-09-09 NOTE — Progress Notes (Signed)
No complaints today.  Continues to feel occ LOF.  No vb.  No pain.  occ ctx.  + FM  AF, VSS + FHT Gen - NAD Abd - gravid, NT  A/P:  Previa, breech, PPROM, parvo exposure Bedrest BMZ IV abx MFM following

## 2011-09-09 NOTE — Progress Notes (Signed)
09/09/11 1035  Clinical Encounter Type  Visited With Patient and family together (Husband [name].)  Visit Type Initial;Social support;Spiritual support  Referral From Nurse  Spiritual Encounters  Spiritual Needs Emotional  Stress Factors  Patient Stress Factors (4 children at home; first ante experience.)    Made initial visit with [name] and husband [name], who have four children at home (ages 2, 51, 109, 8).  [name] works from home for [employer]. They report strong support, including meals and childcare, from friends at church.  Provided intro to chaplain services, pastoral presence.  Family upbeat.  Spiritual Care will follow for support.  Avis Epley, [place] Chaplain [phone]

## 2011-09-10 MED ORDER — TERBUTALINE SULFATE 1 MG/ML IJ SOLN
0.2500 mg | Freq: Once | INTRAMUSCULAR | Status: AC
  Administered 2011-09-10: 0.25 mg via SUBCUTANEOUS
  Filled 2011-09-10: qty 1

## 2011-09-10 NOTE — Progress Notes (Signed)
PROBLEM LIST: 1. Probable PPROM with high leak started on Sunday 2. BREECH 3. Placenta PREVIA 4. History of exposure to Parvovirus this pregnancy  S: Today, the patient reports good FM. Denies any contractions or vaginal bleeding. She had 1 episode yesterday evening of passage of mucus consistent per nurse with mucus plug. No watery discharge.  O: Afebrile Vital Signs stable FHR is reactive Toco no contractions Abdomen is soft and uterus is non tender  IMPRESSION: IUP at 25 w 1 day PPROM Placenta Previa Breech History of Parvovirus Exposure  PLAN: It is believed that this PPROM could represent a high leak that is in the process of sealing over. I discussed with the patient and her spouse that she should continue in hospital bedrest with antibiotics for now.  Status post betamethasone. Also if patient goes with leaking after one week, should consider reevaluating for continued PPROM and if not then she may get to go home on bedrest. Cesarean Section for delivery Maternal fetal medicine is following with [place] and we appreciate their recommendations.

## 2011-09-11 ENCOUNTER — Inpatient Hospital Stay (HOSPITAL_COMMUNITY): Admission: RE | Admit: 2011-09-11 | Payer: 59 | Source: Ambulatory Visit

## 2011-09-11 LAB — TYPE AND SCREEN
ABO/RH(D): A NEG
Antibody Screen: NEGATIVE

## 2011-09-11 NOTE — Progress Notes (Signed)
Patient ID: [name], female   DOB: Jul 31, 1975, 37 y.o.   MRN: [id number] Pt reports GFM  Occas dampness but unsure if its d/c or leaking of amniotic fluid VSSAF FHR + Ctx none Abd Gravid Nt  IUP at 25 weeks PPROM - high leak.  If ? Leaking, repeat amnisure Parvo - weekly [place] (next Tuesday) Previa - stable without bleeding RH neg - if no bleeding , rhogam at 26 weeks DL

## 2011-09-12 MED ORDER — FLUCONAZOLE 150 MG PO TABS
150.0000 mg | ORAL_TABLET | Freq: Once | ORAL | Status: DC
  Filled 2011-09-12: qty 1

## 2011-09-12 NOTE — Progress Notes (Signed)
Patient ID: [name], female   DOB: Jul 26, 1975, 36 y.o.   MRN: [id number] [redacted]w[redacted]d   No further leaking, good FM, FHR 148  Discussed cont obsv after 1 wk of PO ABX

## 2011-09-12 NOTE — Progress Notes (Signed)
UR Chart review completed.  

## 2011-09-12 NOTE — Progress Notes (Signed)
This was a follow-up visit with [name] , the initial visit was made by chaplain, Rush Barer.  [name] was in good spirits.  She is finding it difficult to be here and away from her other children, but is able to relax more here than she would be able to at home.  They have a good deal of family support.  I provided pastoral presence and affirmations.  [employer] Pager, [phone] 11:27 AM   09/12/11 1100  Clinical Encounter Type  Visited With Patient and family together  Visit Type Follow-up  Spiritual Encounters  Spiritual Needs Emotional  Stress Factors  Patient Stress Factors (Stress of not being at home to care for other 4 children.)

## 2011-09-12 NOTE — Progress Notes (Signed)
Pt states that she is leaking small amt of fluid on panty liner.  Panty liners left in trash examined by RN and no discharge or moisture observed.

## 2011-09-12 NOTE — Progress Notes (Signed)
Pt noted more pressure and cramping when up the bathroom this time. Pt feels discomfort in the lower uterine segment when up the bathroom - stating a heaviness in the lower part of her abdoment. Clear discharge noted - scant amount, on peri-pad when up to the bathroom. Pt reports pressure and cramping are better after laying down. Will apply monitors to check FHR status of baby. No uc's noted on previous strip.S.Erika Mcnew,rn

## 2011-09-13 NOTE — Progress Notes (Signed)
Patient ID: [name], female   DOB: 1975-09-19, 36 y.o.   MRN: [id number]   [redacted]w[redacted]d   S// no new complaints, only scant leakage  O//BP 94/54  Pulse 97  Temp(Src) 98.2 F (36.8 C) (Oral)  Resp 20  Ht 5\' 2"  (1.575 m)  Wt 166 lb (75.297 kg)  BMI 30.36 kg/m2  LMP 03/21/2011  FHR stable  A// 25 4/7 weeks  Previa PPROM, + Amnisure with now normal AFI  P//cont oral ABX X 7 days>>repeat USApril 2

## 2011-09-14 LAB — TYPE AND SCREEN
ABO/RH(D): A NEG
Antibody Screen: NEGATIVE

## 2011-09-14 NOTE — Progress Notes (Signed)
Pt called out. States having light yellow mucus discharge on pad. Scant amount noted on wipe.

## 2011-09-14 NOTE — Progress Notes (Signed)
Patient ID: [name], female   DOB: 12-Feb-1976, 36 y.o.   MRN: [id number] [redacted]w[redacted]d  S//  Min vag D/C yest  O// BP 97/54  Pulse 85  Temp(Src) 98.4 F (36.9 C) (Oral)  Resp 18  Ht 5\' 2"  (1.575 m)  Wt 166 lb (75.297 kg)  BMI 30.36 kg/m2  LMP 03/21/2011   FHR stable  A+P// ?PPROM , now with min vag leakage and nl AFI        Previa, 25 5/7 weeks  For [place] Tuesday

## 2011-09-15 ENCOUNTER — Other Ambulatory Visit: Payer: Self-pay

## 2011-09-15 NOTE — Significant Event (Signed)
Pt co's of "different feeling in chest will check heart rate and seems to be skipping a beat"  MD aware and orders rec'd

## 2011-09-15 NOTE — Progress Notes (Signed)
25.[redacted] weeks gestation, with R/O SROM.  Height  62" Weight 166 Lbs pre-pregnancy weight 146 Lbs.Pre-pregnancy  BMI 26.6  IBW 110 Lbs  Total weight gain 20 Lbs. Weight gain goals 15-25 Lbs.   Estimated needs: 16-1800 kcal/day, 60-70  grams protein/day, 1.8 liters fluid/day Regular diet tolerated well, appetite good. Changed diet order to antenatal regular diet to allow snacks TID Current diet prescription will provide for increased needs. No abnormal nutrition related labs  Nutrition Dx: Increased nutrient needs r/t pregnancy and fetal growth requirements aeb [redacted] weeks gestation.  No educational needs assessed at this time.

## 2011-09-15 NOTE — Progress Notes (Signed)
Patient ID: [name], female   DOB: 02-13-1976, 36 y.o.   MRN: [id number] S: SOME MUCOUS NOTED NO OVERT LEAKAGE O:  AF VSS       GRAVID UTERUS NONTENDER        A:  IUP 25.6 WITH POSSIBLE SROM      PREVIA      PARVOVRIUS INFECTION P: SONO TOMORROW WITH MFM.  CONTINUE EXP MANAGMENT

## 2011-09-15 NOTE — Progress Notes (Signed)
Patient ID: [name], female   DOB: 11-20-1975, 36 y.o.   MRN: [id number] episodes of skip heart beat was seeing cardiologist before admission On exam regular rhythm and rate ekg short pr iterval otherwise ok Echocardiogram earlier normal If continues contact cardiologist for eval

## 2011-09-16 ENCOUNTER — Inpatient Hospital Stay (HOSPITAL_COMMUNITY): Payer: 59

## 2011-09-16 DIAGNOSIS — O4400 Placenta previa specified as without hemorrhage, unspecified trimester: Secondary | ICD-10-CM

## 2011-09-16 LAB — AMNISURE RUPTURE OF MEMBRANE (ROM) NOT AT ARMC: Amnisure ROM: NEGATIVE

## 2011-09-16 NOTE — Discharge Instructions (Signed)
Call the office or come to [facility] with any fevers, decreased fetal movement, vaginal bleeding or leaking fluid   Placenta Previa Placenta previa is a condition in which the placenta has grown low in the womb (uterus). This is a condition in which the organ which connects the fetus to the mother's uterus (placenta) is low in the opening in the uterus (cervix). It can partially or completely cover the cervix. The cause of this is unknown. It is more common with multiple births or twins. SYMPTOMS  The main symptom or sign of placenta previa is vaginal bleeding. The bleeding can be mild to very heavy. This condition can be very serious for the mother and baby. Often there are no symptoms with placenta previa. Sometimes if the location of the placenta is very low it will become partially detached and cause bleeding. This may be simply a marginal sinus separation of the placenta. This is a separation of the vessels from the wall of the uterus. This may cause no further problems other than mild anxiety. There is an increase risk of intrauterine growth restriction (IUGR) with placenta previa because of the abnormal placement of the placenta. DIAGNOSIS  The diagnosis is usually made by ultrasound exam of the uterus. There may be a careful vaginal exam to see the cervix. The patient will be prepared for a Cesarean section immediately if necessary. TREATMENT  Treatment for placenta previa is usually bed rest in the hospital or at home. You may be given medication to stop contractions. Contractions can increase bleeding. Your doctor may take fluid from the baby's sac (amniocentesis) to see if the baby's lungs are mature enough for a C-section. A blood transfusion may be necessary if you have a low blood count. No further treatment may be needed when placenta previa is present in small degrees. Early placenta previa may resolve on it's own. The placenta moves higher in the birth canal as pregnancy progresses.  In this case the placenta no longer is an obstruction to birth. The position of the placenta may need to be reconfirmed during pregnancy. This can be done with an ultrasound exam of the belly(abdomen). Call your caregiver immediately if blood loss is severe. Immediate fluid or blood replacement may be necessary. With complete placenta previa, the only way to safely deliver the baby is by Cesarean section. HOME CARE INSTRUCTIONS   Follow your caregiver's advice about bed rest.   Take any iron pills or other medications your doctor gives to you.   No bending or lifting.   Do not have sexual intercourse.   Do not put anything in your vagina (tampons or vaginal creams). If you are bleeding, use sanitary pads.   Keep your doctors appointments as scheduled. Not keeping the appointment could result in a chronic or permanent injury, pain, disability and injury or death to you or your unborn baby. If there is any problem keeping the appointment, you must call back to this facility for assistance.  SEEK IMMEDIATE MEDICAL CARE IF:   You have increased bleeding.   You have fainting episodes or feel lightheaded.   You develop abdominal pain.   You can no longer feel normal fetal or baby movements.   You develop uterine contractions.  Document Released: 06/02/2005 Document Revised: 05/22/2011 Document Reviewed: 01/14/2008 [facility] Patient Information 2012 [place], [place].

## 2011-09-16 NOTE — Progress Notes (Signed)
Amnisure was neg, pt desires discharge.   Rec home bedrest with f/u Monday.   [facility] daily [place] w/ MFM qwk

## 2011-09-16 NOTE — Progress Notes (Signed)
Pt had [place] today.    Dopplers were wnl.  Cervical length stable and AFV normal.  Amnisure test done, labs pending.  Consider d/c with close outpt monitoring if amnisure neg

## 2011-09-16 NOTE — Progress Notes (Signed)
Pt reports no real leaking since admission.  No vb.  Good FM  AF, VSS Reassuring FHT Gen - NAD Abd - gravid, NT  A/P:  PPROM, parvo exposure [place] w/ dopplers today. Consider repeat amnisure if AFV normal on [place] today

## 2011-09-17 NOTE — Discharge Summary (Signed)
[name], [name]               ACCOUNT NO.:  [id number]  MEDICAL RECORD NO.:  [id number]  LOCATION:  9153                          FACILITY:  [facility]  PHYSICIAN:  Zelphia Cairo, MD    DATE OF BIRTH:  [birth year]  DATE OF ADMISSION:  09/07/2011 DATE OF DISCHARGE:  09/16/2011                              DISCHARGE SUMMARY   REASON FOR ADMISSION: 1. Preterm premature rupture of membranes. 2. Placenta previa. 3. Exposure of parvovirus.  DISCHARGE DIAGNOSES: 1. Placenta previa. 2. Exposure of parvovirus. 3. History of preterm premature rupture of membranes, AmniSure now     negative.  HOSPITAL COURSE:  The patient was admitted after a positive AmniSure was performed in the Maternity Admission Unit after the patient noticed a gush of fluid here on the day of admission.  She was admitted and given IV antibiotics for 2 days, IM betamethasone for 48 hours, followed by 5 days of oral antibiotics.  The patient remained stable and afebrile throughout her hospital course.  Antenatal testing remained reassuring. She did not have any leaking of fluid for almost 1 week.  She noticed mucousy vaginal discharge, but no clear watery fluid.  On the day of discharge, her AFI was normal.  Dopplers were normal and AmniSure was negative.  Recommended home bedrest and followup in 1 week.  The patient has once weekly ultrasound scheduled with Maternal Fetal Medicine.  The patient understands to call the office to report to the hospital with any vaginal bleeding or leaking of fluid or fevers.     Zelphia Cairo, MD     GA/MEDQ  D:  09/16/2011  T:  09/16/2011  Job:  [id number]

## 2011-09-18 ENCOUNTER — Inpatient Hospital Stay (HOSPITAL_COMMUNITY): Admission: RE | Admit: 2011-09-18 | Payer: 59 | Source: Ambulatory Visit

## 2011-09-19 NOTE — Progress Notes (Signed)
UR Chart review completed.  

## 2011-09-23 ENCOUNTER — Ambulatory Visit (HOSPITAL_COMMUNITY)
Admit: 2011-09-23 | Discharge: 2011-09-23 | Disposition: A | Discharge status: Home / Self Care | Payer: 59 | Attending: Obstetrics and Gynecology | Admitting: Obstetrics and Gynecology

## 2011-09-23 ENCOUNTER — Other Ambulatory Visit (HOSPITAL_COMMUNITY): Payer: Self-pay | Admitting: Maternal and Fetal Medicine

## 2011-09-23 ENCOUNTER — Ambulatory Visit (HOSPITAL_COMMUNITY)
Admission: RE | Admit: 2011-09-23 | Discharge: 2011-09-23 | Disposition: A | Discharge status: Home / Self Care | Payer: 59 | Source: Ambulatory Visit | Attending: Obstetrics and Gynecology | Admitting: Obstetrics and Gynecology

## 2011-09-23 VITALS — BP 110/68 | HR 93 | Wt 164.0 lb

## 2011-09-23 DIAGNOSIS — B9789 Other viral agents as the cause of diseases classified elsewhere: Secondary | ICD-10-CM | POA: Insufficient documentation

## 2011-09-23 DIAGNOSIS — O98519 Other viral diseases complicating pregnancy, unspecified trimester: Secondary | ICD-10-CM

## 2011-09-23 DIAGNOSIS — B083 Erythema infectiosum [fifth disease]: Secondary | ICD-10-CM

## 2011-09-23 DIAGNOSIS — O4290 Premature rupture of membranes, unspecified as to length of time between rupture and onset of labor, unspecified weeks of gestation: Secondary | ICD-10-CM | POA: Insufficient documentation

## 2011-09-23 DIAGNOSIS — O4400 Placenta previa specified as without hemorrhage, unspecified trimester: Secondary | ICD-10-CM | POA: Insufficient documentation

## 2011-09-25 ENCOUNTER — Inpatient Hospital Stay (HOSPITAL_COMMUNITY): Admission: RE | Admit: 2011-09-25 | Payer: 59 | Source: Ambulatory Visit

## 2011-09-26 ENCOUNTER — Ambulatory Visit (HOSPITAL_COMMUNITY)
Admission: RE | Admit: 2011-09-26 | Discharge: 2011-09-26 | Disposition: A | Discharge status: Home / Self Care | Payer: 59 | Source: Ambulatory Visit | Attending: Obstetrics and Gynecology | Admitting: Obstetrics and Gynecology

## 2011-09-26 DIAGNOSIS — O98519 Other viral diseases complicating pregnancy, unspecified trimester: Secondary | ICD-10-CM | POA: Insufficient documentation

## 2011-09-26 DIAGNOSIS — B9789 Other viral agents as the cause of diseases classified elsewhere: Secondary | ICD-10-CM | POA: Insufficient documentation

## 2011-09-26 DIAGNOSIS — O4290 Premature rupture of membranes, unspecified as to length of time between rupture and onset of labor, unspecified weeks of gestation: Secondary | ICD-10-CM | POA: Insufficient documentation

## 2011-09-26 DIAGNOSIS — O4400 Placenta previa specified as without hemorrhage, unspecified trimester: Secondary | ICD-10-CM | POA: Insufficient documentation

## 2011-09-30 ENCOUNTER — Ambulatory Visit (HOSPITAL_COMMUNITY)
Admission: RE | Admit: 2011-09-30 | Discharge: 2011-09-30 | Disposition: A | Discharge status: Home / Self Care | Payer: 59 | Source: Ambulatory Visit | Attending: Obstetrics and Gynecology | Admitting: Obstetrics and Gynecology

## 2011-09-30 ENCOUNTER — Ambulatory Visit (HOSPITAL_COMMUNITY): Payer: 59

## 2011-09-30 ENCOUNTER — Other Ambulatory Visit (HOSPITAL_COMMUNITY): Payer: Self-pay | Admitting: Maternal and Fetal Medicine

## 2011-09-30 VITALS — BP 107/65 | HR 100 | Wt 167.0 lb

## 2011-09-30 DIAGNOSIS — N049 Nephrotic syndrome with unspecified morphologic changes: Secondary | ICD-10-CM

## 2011-09-30 DIAGNOSIS — O4400 Placenta previa specified as without hemorrhage, unspecified trimester: Secondary | ICD-10-CM

## 2011-09-30 DIAGNOSIS — B9789 Other viral agents as the cause of diseases classified elsewhere: Secondary | ICD-10-CM | POA: Insufficient documentation

## 2011-09-30 DIAGNOSIS — B083 Erythema infectiosum [fifth disease]: Secondary | ICD-10-CM

## 2011-09-30 DIAGNOSIS — O98519 Other viral diseases complicating pregnancy, unspecified trimester: Secondary | ICD-10-CM

## 2011-10-03 ENCOUNTER — Encounter: Payer: Self-pay | Admitting: Cardiology

## 2011-10-07 ENCOUNTER — Ambulatory Visit (HOSPITAL_COMMUNITY): Payer: 59

## 2011-10-07 ENCOUNTER — Ambulatory Visit: Payer: 59 | Admitting: Cardiology

## 2011-10-07 ENCOUNTER — Ambulatory Visit (HOSPITAL_COMMUNITY)
Admission: RE | Admit: 2011-10-07 | Discharge: 2011-10-07 | Disposition: A | Discharge status: Home / Self Care | Payer: 59 | Source: Ambulatory Visit | Attending: Obstetrics and Gynecology | Admitting: Obstetrics and Gynecology

## 2011-10-07 VITALS — BP 102/66 | HR 100 | Wt 168.0 lb

## 2011-10-07 DIAGNOSIS — O98519 Other viral diseases complicating pregnancy, unspecified trimester: Secondary | ICD-10-CM | POA: Insufficient documentation

## 2011-10-07 DIAGNOSIS — O4400 Placenta previa specified as without hemorrhage, unspecified trimester: Secondary | ICD-10-CM | POA: Insufficient documentation

## 2011-10-07 DIAGNOSIS — B083 Erythema infectiosum [fifth disease]: Secondary | ICD-10-CM

## 2011-10-07 DIAGNOSIS — B9789 Other viral agents as the cause of diseases classified elsewhere: Secondary | ICD-10-CM | POA: Insufficient documentation

## 2011-10-07 DIAGNOSIS — N049 Nephrotic syndrome with unspecified morphologic changes: Secondary | ICD-10-CM

## 2011-10-07 DIAGNOSIS — O4290 Premature rupture of membranes, unspecified as to length of time between rupture and onset of labor, unspecified weeks of gestation: Secondary | ICD-10-CM | POA: Insufficient documentation

## 2011-10-09 ENCOUNTER — Ambulatory Visit (HOSPITAL_COMMUNITY)
Admission: RE | Admit: 2011-10-09 | Discharge: 2011-10-09 | Disposition: A | Discharge status: Home / Self Care | Payer: 59 | Source: Ambulatory Visit | Attending: Obstetrics and Gynecology | Admitting: Obstetrics and Gynecology

## 2011-10-09 DIAGNOSIS — B9789 Other viral agents as the cause of diseases classified elsewhere: Secondary | ICD-10-CM | POA: Insufficient documentation

## 2011-10-09 DIAGNOSIS — O4400 Placenta previa specified as without hemorrhage, unspecified trimester: Secondary | ICD-10-CM | POA: Insufficient documentation

## 2011-10-09 DIAGNOSIS — O4290 Premature rupture of membranes, unspecified as to length of time between rupture and onset of labor, unspecified weeks of gestation: Secondary | ICD-10-CM | POA: Insufficient documentation

## 2011-10-09 DIAGNOSIS — B083 Erythema infectiosum [fifth disease]: Secondary | ICD-10-CM

## 2011-10-09 DIAGNOSIS — O98519 Other viral diseases complicating pregnancy, unspecified trimester: Secondary | ICD-10-CM | POA: Insufficient documentation

## 2011-10-13 ENCOUNTER — Other Ambulatory Visit (HOSPITAL_COMMUNITY): Payer: Self-pay | Admitting: Maternal and Fetal Medicine

## 2011-10-13 ENCOUNTER — Ambulatory Visit (HOSPITAL_COMMUNITY)
Admission: RE | Admit: 2011-10-13 | Discharge: 2011-10-13 | Disposition: A | Discharge status: Home / Self Care | Payer: 59 | Source: Ambulatory Visit | Attending: Obstetrics and Gynecology | Admitting: Obstetrics and Gynecology

## 2011-10-13 DIAGNOSIS — O4290 Premature rupture of membranes, unspecified as to length of time between rupture and onset of labor, unspecified weeks of gestation: Secondary | ICD-10-CM | POA: Insufficient documentation

## 2011-10-13 DIAGNOSIS — B083 Erythema infectiosum [fifth disease]: Secondary | ICD-10-CM

## 2011-10-13 DIAGNOSIS — O98519 Other viral diseases complicating pregnancy, unspecified trimester: Secondary | ICD-10-CM | POA: Insufficient documentation

## 2011-10-13 DIAGNOSIS — O4400 Placenta previa specified as without hemorrhage, unspecified trimester: Secondary | ICD-10-CM

## 2011-10-13 DIAGNOSIS — B9789 Other viral agents as the cause of diseases classified elsewhere: Secondary | ICD-10-CM | POA: Insufficient documentation

## 2011-10-13 NOTE — Progress Notes (Signed)
[name] was seen for ultrasound appointment today.  Please see [facility] report for details.  

## 2011-10-20 ENCOUNTER — Other Ambulatory Visit (HOSPITAL_COMMUNITY): Payer: Self-pay | Admitting: Obstetrics and Gynecology

## 2011-10-20 DIAGNOSIS — O4290 Premature rupture of membranes, unspecified as to length of time between rupture and onset of labor, unspecified weeks of gestation: Secondary | ICD-10-CM

## 2011-10-20 DIAGNOSIS — O98519 Other viral diseases complicating pregnancy, unspecified trimester: Secondary | ICD-10-CM

## 2011-10-20 DIAGNOSIS — O4400 Placenta previa specified as without hemorrhage, unspecified trimester: Secondary | ICD-10-CM

## 2011-10-21 ENCOUNTER — Ambulatory Visit (HOSPITAL_COMMUNITY): Payer: 59

## 2011-10-24 ENCOUNTER — Ambulatory Visit (HOSPITAL_COMMUNITY): Payer: 59

## 2011-10-28 ENCOUNTER — Ambulatory Visit (HOSPITAL_COMMUNITY): Payer: 59

## 2011-10-31 ENCOUNTER — Ambulatory Visit (HOSPITAL_COMMUNITY)
Admission: RE | Admit: 2011-10-31 | Discharge: 2011-10-31 | Disposition: A | Discharge status: Home / Self Care | Payer: 59 | Source: Ambulatory Visit | Attending: Obstetrics and Gynecology | Admitting: Obstetrics and Gynecology

## 2011-10-31 VITALS — BP 113/68 | HR 98 | Wt 168.0 lb

## 2011-10-31 DIAGNOSIS — O4400 Placenta previa specified as without hemorrhage, unspecified trimester: Secondary | ICD-10-CM | POA: Insufficient documentation

## 2011-10-31 DIAGNOSIS — B9789 Other viral agents as the cause of diseases classified elsewhere: Secondary | ICD-10-CM | POA: Insufficient documentation

## 2011-10-31 DIAGNOSIS — O4290 Premature rupture of membranes, unspecified as to length of time between rupture and onset of labor, unspecified weeks of gestation: Secondary | ICD-10-CM

## 2011-10-31 DIAGNOSIS — O98519 Other viral diseases complicating pregnancy, unspecified trimester: Secondary | ICD-10-CM | POA: Insufficient documentation

## 2011-11-04 ENCOUNTER — Other Ambulatory Visit (HOSPITAL_COMMUNITY): Payer: 59

## 2011-11-04 ENCOUNTER — Other Ambulatory Visit (HOSPITAL_COMMUNITY): Payer: Self-pay | Admitting: Maternal and Fetal Medicine

## 2011-11-04 ENCOUNTER — Ambulatory Visit (HOSPITAL_COMMUNITY)
Admission: RE | Admit: 2011-11-04 | Discharge: 2011-11-04 | Disposition: A | Discharge status: Home / Self Care | Payer: 59 | Source: Ambulatory Visit | Attending: Obstetrics and Gynecology | Admitting: Obstetrics and Gynecology

## 2011-11-04 DIAGNOSIS — O4290 Premature rupture of membranes, unspecified as to length of time between rupture and onset of labor, unspecified weeks of gestation: Secondary | ICD-10-CM | POA: Insufficient documentation

## 2011-11-04 DIAGNOSIS — O4400 Placenta previa specified as without hemorrhage, unspecified trimester: Secondary | ICD-10-CM | POA: Insufficient documentation

## 2011-11-04 DIAGNOSIS — O98519 Other viral diseases complicating pregnancy, unspecified trimester: Secondary | ICD-10-CM

## 2011-11-07 ENCOUNTER — Ambulatory Visit (HOSPITAL_COMMUNITY)
Admission: RE | Admit: 2011-11-07 | Discharge: 2011-11-07 | Disposition: A | Discharge status: Home / Self Care | Payer: 59 | Source: Ambulatory Visit | Attending: Obstetrics and Gynecology | Admitting: Obstetrics and Gynecology

## 2011-11-07 DIAGNOSIS — O98519 Other viral diseases complicating pregnancy, unspecified trimester: Secondary | ICD-10-CM | POA: Insufficient documentation

## 2011-11-07 DIAGNOSIS — O4400 Placenta previa specified as without hemorrhage, unspecified trimester: Secondary | ICD-10-CM | POA: Insufficient documentation

## 2011-11-07 DIAGNOSIS — B9789 Other viral agents as the cause of diseases classified elsewhere: Secondary | ICD-10-CM | POA: Insufficient documentation

## 2011-11-07 DIAGNOSIS — O4290 Premature rupture of membranes, unspecified as to length of time between rupture and onset of labor, unspecified weeks of gestation: Secondary | ICD-10-CM | POA: Insufficient documentation

## 2011-11-07 NOTE — Progress Notes (Signed)
[name] was seen for ultrasound appointment today.  Please see [facility] report for details.

## 2011-11-08 ENCOUNTER — Encounter (HOSPITAL_COMMUNITY): Payer: Self-pay | Admitting: *Deleted

## 2011-11-08 ENCOUNTER — Inpatient Hospital Stay (HOSPITAL_COMMUNITY)
Admission: AD | Admit: 2011-11-08 | Discharge: 2011-11-08 | Disposition: A | Discharge status: Home / Self Care | Payer: 59 | Source: Ambulatory Visit | Attending: Obstetrics and Gynecology | Admitting: Obstetrics and Gynecology

## 2011-11-08 DIAGNOSIS — R109 Unspecified abdominal pain: Secondary | ICD-10-CM | POA: Insufficient documentation

## 2011-11-08 DIAGNOSIS — O4700 False labor before 37 completed weeks of gestation, unspecified trimester: Secondary | ICD-10-CM | POA: Insufficient documentation

## 2011-11-08 LAB — WET PREP, GENITAL
Clue Cells Wet Prep HPF POC: NONE SEEN
Trich, Wet Prep: NONE SEEN
Yeast Wet Prep HPF POC: NONE SEEN

## 2011-11-08 LAB — URINALYSIS, ROUTINE W REFLEX MICROSCOPIC
Bilirubin Urine: NEGATIVE
Hgb urine dipstick: NEGATIVE
Ketones, ur: 15 mg/dL — AB
Protein, ur: NEGATIVE mg/dL
Urobilinogen, UA: 0.2 mg/dL (ref 0.0–1.0)

## 2011-11-08 LAB — URINE MICROSCOPIC-ADD ON

## 2011-11-08 MED ORDER — ONDANSETRON 8 MG PO TBDP
8.0000 mg | ORAL_TABLET | Freq: Once | ORAL | Status: AC
  Administered 2011-11-08: 8 mg via ORAL
  Filled 2011-11-08: qty 1

## 2011-11-08 MED ORDER — TERBUTALINE SULFATE 1 MG/ML IJ SOLN
0.2500 mg | INTRAMUSCULAR | Status: DC
  Filled 2011-11-08: qty 1

## 2011-11-08 MED ORDER — ONDANSETRON 4 MG PO TBDP: 4.0000 mg | ORAL_TABLET | Freq: Three times a day (TID) | ORAL | Status: AC | PRN

## 2011-11-08 NOTE — MAU Provider Note (Signed)
Chief Complaint:  Contractions and Diarrhea   First Provider Initiated Contact with Patient 11/08/11 1741      HPI  [name] is a 36 y.o. G5P4004 at [redacted]w[redacted]d presenting with cramping x1 week with onset of diarrhea  And nausea today and worsening pain with contractions/cramping.  She has hx in this pregnancy of placenta previa and Parvo virus with fetal transfusions.  She was admitted at 24 weeks for PPROM with positive Amnisure and D/C'd when leakage stopped and Amnisure negative.  She received betamethasone and IV abx during hospital stay.  She reports good fetal movement, denies LOF, vaginal bleeding, vaginal itching/burning, urinary symptoms, h/a, dizziness, vomiting, or fever/chills.    Pregnancy Course: uncomplicated  Past Medical History: Past Medical History  Diagnosis Date  . NEPHROTIC SYNDROME 04/19/2009  . DIZZINESS 03/07/2010    Past Surgical History: Past Surgical History  Procedure Date  . None   . Wisdom tooth extraction     Family History: Family History  Problem Relation Age of Onset  . Diabetes Father     Type II  . Hypertension Neg Hx   . Coronary artery disease Neg Hx   . Stroke Neg Hx   . Colon cancer Neg Hx   . Anesthesia problems Neg Hx     Social History: History  Substance Use Topics  . Smoking status: Never Smoker   . Smokeless tobacco: Not on file  . Alcohol Use: No    Allergies: No Known Allergies  Meds:  Prescriptions prior to admission  Medication Sig Dispense Refill  . acetaminophen (TYLENOL) 500 MG tablet Take 1,000 mg by mouth as needed. For abdominal pain.      . calcium carbonate (TUMS - DOSED IN MG ELEMENTAL CALCIUM) 500 MG chewable tablet Chew 1 tablet by mouth daily as needed. heartburn      . Prenatal Vit-Fe Fumarate-FA (PRENATAL MULTIVITAMIN) TABS Take 1 tablet by mouth daily.          Physical Exam  Blood pressure 121/69, pulse 115, temperature 97.9 F (36.6 C), temperature source Oral, resp. rate 20, height 5\' 2"   (1.575 m), weight 76.885 kg (169 lb 8 oz), last menstrual period 03/21/2011. GENERAL: Well-developed, well-nourished female in no acute distress.  HEENT: normocephalic, good dentition HEART: normal rate RESP: normal effort ABDOMEN: Soft, nontender, nondistended, gravid.  EXTREMITIES: Nontender, no edema NEURO: alert and oriented  SPECULUM EXAM: Dilation: Closed Effacement (%): Thick Cervical Position: Middle Presentation: Transverse  FHT:  Baseline 135 , moderate variability, accelerations present, no decelerations Contractions: q 5-10 mins with irritability in between   Labs: Results for orders placed during the hospital encounter of 11/08/11 (from the past 24 hour(s))  URINALYSIS, ROUTINE W REFLEX MICROSCOPIC     Status: Abnormal   Collection Time   11/08/11  4:16 PM      Component Value Range   Color, Urine YELLOW  YELLOW    APPearance HAZY (*) CLEAR    Specific Gravity, Urine 1.025  1.005 - 1.030    pH 6.0  5.0 - 8.0    Glucose, UA NEGATIVE  NEGATIVE (mg/dL)   Hgb urine dipstick NEGATIVE  NEGATIVE    Bilirubin Urine NEGATIVE  NEGATIVE    Ketones, ur 15 (*) NEGATIVE (mg/dL)   Protein, ur NEGATIVE  NEGATIVE (mg/dL)   Urobilinogen, UA 0.2  0.0 - 1.0 (mg/dL)   Nitrite NEGATIVE  NEGATIVE    Leukocytes, UA SMALL (*) NEGATIVE   URINE MICROSCOPIC-ADD ON     Status:  Abnormal   Collection Time   11/08/11  4:16 PM      Component Value Range   Squamous Epithelial / LPF FEW (*) RARE    WBC, UA 0-2  <3 (WBC/hpf)   Bacteria, UA FEW (*) RARE    Urine-Other MUCOUS PRESENT       Assessment: Threatened preterm labor   Plan: Called Dr Henderson Cloud to review assessment and findings Gentle cervical exam  Zofran 8 mg ODT Terbutaline ordered but pt refused r/t history reported of fetal arrhythmia Contractions with no change during MAU stay and cervix closed and thick  D/C home F/U in office next week Return to MAU as needed  [name], [name] 5/25/20137:49 PM

## 2011-11-08 NOTE — Progress Notes (Signed)
OK to d/c efm per Lisa Leftwich-Kirby CNM 

## 2011-11-08 NOTE — Progress Notes (Signed)
Spec exam by leftwich-Kirby CNM. Wet prep collected. Pt tol well.

## 2011-11-08 NOTE — Progress Notes (Signed)
Written and verbal d/c instructions given and understanding voiced. 

## 2011-11-08 NOTE — Progress Notes (Signed)
Up to BR to void. No diarrhea since being admitted. States nausea about the same as before taking Zofran. Irregularities in FHR noted. No particular pattern to irregularities

## 2011-11-08 NOTE — Progress Notes (Signed)
Sharen Counter CNM in and discussed along time with pt regarding previa. Pt reassured. Will do spec exam and pt states will fill ok going home if cervix still closed.

## 2011-11-08 NOTE — MAU Note (Signed)
RN in to d/c pt home. Pt voices concerns going home contracting with previa. Sharen Counter CNM aware and will see pt.

## 2011-11-08 NOTE — Discharge Instructions (Signed)

## 2011-11-08 NOTE — MAU Note (Signed)
Pt stats, " I was having sharp and crampy pain in my abdomen Monday  Thru Wed but felt better Thurs and Friday. I think I lost my mucus plug on Thurs. Today I woke up at 0345 and was contracting every five-seven min and was having cramping as well. Then I fell back to sleep and awoke at 9 am and started having diarrhea, and I've gone five or six times.  My whole abdomen hurts and I'm having cramping and contractions well."

## 2011-11-11 ENCOUNTER — Other Ambulatory Visit (HOSPITAL_COMMUNITY): Payer: Self-pay | Admitting: Maternal and Fetal Medicine

## 2011-11-11 ENCOUNTER — Ambulatory Visit (HOSPITAL_COMMUNITY)
Admission: RE | Admit: 2011-11-11 | Discharge: 2011-11-11 | Disposition: A | Discharge status: Home / Self Care | Payer: 59 | Source: Ambulatory Visit | Attending: Obstetrics and Gynecology | Admitting: Obstetrics and Gynecology

## 2011-11-11 ENCOUNTER — Encounter (HOSPITAL_COMMUNITY): Payer: Self-pay

## 2011-11-11 VITALS — BP 115/74 | HR 113 | Wt 170.0 lb

## 2011-11-11 DIAGNOSIS — O4400 Placenta previa specified as without hemorrhage, unspecified trimester: Secondary | ICD-10-CM | POA: Insufficient documentation

## 2011-11-11 DIAGNOSIS — O98519 Other viral diseases complicating pregnancy, unspecified trimester: Secondary | ICD-10-CM

## 2011-11-11 DIAGNOSIS — O4290 Premature rupture of membranes, unspecified as to length of time between rupture and onset of labor, unspecified weeks of gestation: Secondary | ICD-10-CM | POA: Insufficient documentation

## 2011-11-11 DIAGNOSIS — B9789 Other viral agents as the cause of diseases classified elsewhere: Secondary | ICD-10-CM | POA: Insufficient documentation

## 2011-11-11 NOTE — Progress Notes (Signed)
Patient seen for follow up BPP.  See report in [facility].  Alpha Gula, MD  Single living intrauterine pregnancy at 33 weeks 4 days. Normal amniotic fluid volume. BPP 8/8. No evidence of hydrops.  Continue twice weekly NSTs with weekly AFIs or BPPs.

## 2011-11-12 ENCOUNTER — Encounter (HOSPITAL_COMMUNITY): Payer: Self-pay | Admitting: Pharmacist

## 2011-11-14 ENCOUNTER — Other Ambulatory Visit (HOSPITAL_COMMUNITY): Payer: Self-pay | Admitting: Obstetrics and Gynecology

## 2011-11-14 ENCOUNTER — Ambulatory Visit (HOSPITAL_COMMUNITY)
Admission: RE | Admit: 2011-11-14 | Discharge: 2011-11-14 | Disposition: A | Discharge status: Home / Self Care | Payer: 59 | Source: Ambulatory Visit | Attending: Obstetrics and Gynecology | Admitting: Obstetrics and Gynecology

## 2011-11-14 VITALS — BP 105/64 | HR 98 | Wt 176.0 lb

## 2011-11-14 DIAGNOSIS — Z09 Encounter for follow-up examination after completed treatment for conditions other than malignant neoplasm: Secondary | ICD-10-CM

## 2011-11-14 DIAGNOSIS — B9789 Other viral agents as the cause of diseases classified elsewhere: Secondary | ICD-10-CM | POA: Insufficient documentation

## 2011-11-14 DIAGNOSIS — O98519 Other viral diseases complicating pregnancy, unspecified trimester: Secondary | ICD-10-CM | POA: Insufficient documentation

## 2011-11-14 DIAGNOSIS — O4400 Placenta previa specified as without hemorrhage, unspecified trimester: Secondary | ICD-10-CM | POA: Insufficient documentation

## 2011-11-14 DIAGNOSIS — O4290 Premature rupture of membranes, unspecified as to length of time between rupture and onset of labor, unspecified weeks of gestation: Secondary | ICD-10-CM | POA: Insufficient documentation

## 2011-11-14 NOTE — Progress Notes (Signed)
Patient seen for follow up BPP.  See report in [facility].  Alpha Gula, MD  Single living intrauterine pregnancy at 34 weeks 0 days. Normal amniotic fluid volume. Complete placenta previa BPP 8/8. No evidence of hydrops.  Continue twice weekly NSTs with weekly AFIs or BPPs.

## 2011-11-18 ENCOUNTER — Encounter (HOSPITAL_COMMUNITY): Payer: Self-pay | Admitting: *Deleted

## 2011-11-18 ENCOUNTER — Ambulatory Visit (HOSPITAL_COMMUNITY)
Admission: RE | Admit: 2011-11-18 | Discharge: 2011-11-18 | Disposition: A | Discharge status: Home / Self Care | Payer: 59 | Source: Ambulatory Visit | Attending: Obstetrics and Gynecology | Admitting: Obstetrics and Gynecology

## 2011-11-18 ENCOUNTER — Other Ambulatory Visit (HOSPITAL_COMMUNITY): Payer: Self-pay | Admitting: Obstetrics and Gynecology

## 2011-11-18 ENCOUNTER — Encounter (HOSPITAL_COMMUNITY): Payer: Self-pay

## 2011-11-18 ENCOUNTER — Encounter (HOSPITAL_COMMUNITY)
Admission: RE | Admit: 2011-11-18 | Discharge: 2011-11-18 | Disposition: A | Discharge status: Home / Self Care | Payer: 59 | Source: Ambulatory Visit | Attending: Obstetrics and Gynecology | Admitting: Obstetrics and Gynecology

## 2011-11-18 VITALS — BP 105/70 | HR 98 | Wt 173.0 lb

## 2011-11-18 DIAGNOSIS — O4400 Placenta previa specified as without hemorrhage, unspecified trimester: Secondary | ICD-10-CM | POA: Insufficient documentation

## 2011-11-18 DIAGNOSIS — Z09 Encounter for follow-up examination after completed treatment for conditions other than malignant neoplasm: Secondary | ICD-10-CM

## 2011-11-18 DIAGNOSIS — O4290 Premature rupture of membranes, unspecified as to length of time between rupture and onset of labor, unspecified weeks of gestation: Secondary | ICD-10-CM | POA: Insufficient documentation

## 2011-11-18 LAB — CBC
MCHC: 34.5 g/dL (ref 30.0–36.0)
Platelets: 208 10*3/uL (ref 150–400)
RDW: 13 % (ref 11.5–15.5)
WBC: 8.1 10*3/uL (ref 4.0–10.5)

## 2011-11-18 LAB — SURGICAL PCR SCREEN
MRSA, PCR: NEGATIVE
Staphylococcus aureus: NEGATIVE

## 2011-11-18 NOTE — Progress Notes (Signed)
Patient seen for follow up BPP.  See report in [facility].  Gildo Crisco, MD 

## 2011-11-18 NOTE — ED Notes (Signed)
Pt denies leaking or bleeding.  States +FM.

## 2011-11-18 NOTE — Pre-Procedure Instructions (Signed)
Echo report reviewed by Dr Jean Rosenthal. No further orders.

## 2011-11-18 NOTE — H&P (Signed)
[name]  DICTATION # [id number] CSN# [id number]   Meriel Pica, MD 11/18/2011 4:49 PM

## 2011-11-18 NOTE — Patient Instructions (Addendum)
20 [name]  11/18/2011   Your procedure is scheduled on:  11/25/11  Enter through the Main Entrance of [facility] at 1130 AM.  Pick up the phone at the desk and dial [phone].   Call this number if you have problems the morning of surgery: [phone]   Remember:   Do not eat food:After Midnight.  Do not drink clear liquids: 4 Hours before arrival.  Take these medicines the morning of surgery with A SIP OF WATER: NA   Do not wear jewelry, make-up or nail polish.  Do not wear lotions, powders, or perfumes. You may wear deodorant.  Do not shave 48 hours prior to surgery.  Do not bring valuables to the hospital.  Contacts, dentures or bridgework may not be worn into surgery.  Leave suitcase in the car. After surgery it may be brought to your room.  For patients admitted to the hospital, checkout time is 11:00 AM the day of discharge.   Patients discharged the day of surgery will not be allowed to drive home.  Name and phone number of your driver: NA  Special Instructions: CHG Shower Use Special Wash: 1/2 bottle night before surgery and 1/2 bottle morning of surgery.   Please read over the following fact sheets that you were given: MRSA Information

## 2011-11-19 LAB — RPR: RPR Ser Ql: NONREACTIVE

## 2011-11-19 NOTE — H&P (Signed)
[name], [name]               ACCOUNT NO.:  [id number]  MEDICAL RECORD NO.:  [id number]  LOCATION:                                 FACILITY:  PHYSICIAN:  Duke Salvia. Marcelle Overlie, M.D.DATE OF BIRTH:  1975-11-25  DATE OF ADMISSION:  11/25/2011 DATE OF DISCHARGE:                             HISTORY & PHYSICAL   CHIEF COMPLAINT:  Primary cesarean section and tubal ligation for placenta previa.  HISTORY OF PRESENT ILLNESS:  A 36 year old G5, P 4, EDD December 26, 2011. The patient had normal early pregnancy, first trimester screening and AFP only screening.  Early 20 week range she experienced some leaking of fluid and was noted to have positive Parvovirus conversion for IgM. Since that time, she has been followed by MFM with weekly ultrasounds. When she was hospitalized for leaking fluid, she had betamethasone series and was observed with antibiotics initially.  Her fluid slowly reaccumulated.  Her weekly ultrasounds per MFM at one point demonstrated mild fetal anemia, PUBS was carried out for transfusion x1.  She has had a persistent placenta previa without bleeding episode and prior discussions with MFM, decision made to proceed with scheduled primary cesarean section at 35 and 1/2 to 36 weeks mainly because of the placenta previa.  The weekly ultrasound and MCA Doppler studies have been stable on the fetus after the initial PUBS transfusion.  This procedure including risks related to bleeding, infection, transfusion, adjacent organ injury, wound infection, phlebitis, the permanence of the tubal procedure failure rated 2-08/998 all reviewed.  Additionally bleeding complications related to the previa that might result in cesarean hysterectomy reviewed with her.  PAST MEDICAL HISTORY:  Please see the [facility] form for details for past medical history including family history and social history.  PHYSICAL EXAMINATION:  VITAL SIGNS:  Temp 98.2, blood pressure 130/78. HEENT:   Unremarkable. NECK:  Supple without masses. LUNGS:  Clear. CARDIOVASCULAR:  Regular rate and rhythm without murmurs, rubs, or gallops. BREASTS:  Without masses.  Term fundal height.  Fetal heart rate 142. GU:  Cervix not examined. EXTREMITIES:  Unremarkable. NEUROLOGIC:  Unremarkable.  IMPRESSION: 1. Persistent placenta previa at 36 weeks' gestation. 2. Fetal exposure to Parvovirus with positive IgM conversion, PUBS     transfusion x1. 3. Request permanent sterilization.  PLAN:  Primary cesarean section, tubal ligation.  Procedure and risks discussed as above.     [name] M. Marcelle Overlie, M.D.     RMH/MEDQ  D:  11/18/2011  T:  11/18/2011  Job:  [id number]

## 2011-11-21 ENCOUNTER — Ambulatory Visit (HOSPITAL_COMMUNITY)
Admission: RE | Admit: 2011-11-21 | Discharge: 2011-11-21 | Disposition: A | Discharge status: Home / Self Care | Payer: 59 | Source: Ambulatory Visit | Attending: Obstetrics and Gynecology | Admitting: Obstetrics and Gynecology

## 2011-11-21 DIAGNOSIS — Z09 Encounter for follow-up examination after completed treatment for conditions other than malignant neoplasm: Secondary | ICD-10-CM

## 2011-11-21 DIAGNOSIS — B9789 Other viral agents as the cause of diseases classified elsewhere: Secondary | ICD-10-CM | POA: Insufficient documentation

## 2011-11-21 DIAGNOSIS — O4400 Placenta previa specified as without hemorrhage, unspecified trimester: Secondary | ICD-10-CM | POA: Insufficient documentation

## 2011-11-21 DIAGNOSIS — O98519 Other viral diseases complicating pregnancy, unspecified trimester: Secondary | ICD-10-CM | POA: Insufficient documentation

## 2011-11-21 DIAGNOSIS — O4290 Premature rupture of membranes, unspecified as to length of time between rupture and onset of labor, unspecified weeks of gestation: Secondary | ICD-10-CM | POA: Insufficient documentation

## 2011-11-21 NOTE — Progress Notes (Signed)
Patient seen today  for follow up ultrasound.  See full report in [facility].  Alpha Gula, MD  Single living intrauterine pregnancy at 35 weeks 0 days. Fetal growth is appropriate (81st %) Normal amniotic fluid volume. Complete placenta previa BPP 8/8. No evidence of hydrops.  Continue twice weekly NSTs with weekly AFIs or BPPs.  Tentatively scheduled for C-section next week.

## 2011-11-25 ENCOUNTER — Encounter (HOSPITAL_COMMUNITY)
Admission: RE | Disposition: A | Discharge status: Home / Self Care | Payer: Self-pay | Source: Ambulatory Visit | Attending: Obstetrics and Gynecology

## 2011-11-25 ENCOUNTER — Inpatient Hospital Stay (HOSPITAL_COMMUNITY)
Admission: RE | Admit: 2011-11-25 | Discharge: 2011-11-28 | DRG: 765 | Disposition: A | Discharge status: Home / Self Care | Payer: 59 | Source: Ambulatory Visit | Attending: Obstetrics and Gynecology | Admitting: Obstetrics and Gynecology

## 2011-11-25 ENCOUNTER — Encounter (HOSPITAL_COMMUNITY): Payer: Self-pay | Admitting: *Deleted

## 2011-11-25 ENCOUNTER — Encounter (HOSPITAL_COMMUNITY): Payer: Self-pay | Admitting: Anesthesiology

## 2011-11-25 ENCOUNTER — Inpatient Hospital Stay (HOSPITAL_COMMUNITY): Payer: 59 | Admitting: Anesthesiology

## 2011-11-25 ENCOUNTER — Encounter (HOSPITAL_COMMUNITY): Payer: Self-pay | Admitting: General Surgery

## 2011-11-25 DIAGNOSIS — Z302 Encounter for sterilization: Secondary | ICD-10-CM

## 2011-11-25 DIAGNOSIS — O4410 Placenta previa with hemorrhage, unspecified trimester: Principal | ICD-10-CM | POA: Diagnosis present

## 2011-11-25 LAB — CBC
MCHC: 34.7 g/dL (ref 30.0–36.0)
MCV: 90.8 fL (ref 78.0–100.0)
Platelets: 236 10*3/uL (ref 150–400)
RDW: 12.9 % (ref 11.5–15.5)
WBC: 7.7 10*3/uL (ref 4.0–10.5)

## 2011-11-25 LAB — PREPARE RBC (CROSSMATCH)

## 2011-11-25 SURGERY — Surgical Case
Anesthesia: Spinal | Site: Abdomen | Laterality: Bilateral | Wound class: Clean Contaminated

## 2011-11-25 MED ORDER — PHENYLEPHRINE 40 MCG/ML (10ML) SYRINGE FOR IV PUSH (FOR BLOOD PRESSURE SUPPORT)
PREFILLED_SYRINGE | INTRAVENOUS | Status: AC
  Filled 2011-11-25: qty 5

## 2011-11-25 MED ORDER — ONDANSETRON HCL 4 MG/2ML IJ SOLN
INTRAMUSCULAR | Status: AC
  Filled 2011-11-25: qty 2

## 2011-11-25 MED ORDER — NALOXONE HCL 0.4 MG/ML IJ SOLN: 0.4000 mg | INTRAMUSCULAR | Status: DC | PRN

## 2011-11-25 MED ORDER — LACTATED RINGERS IV SOLN
INTRAVENOUS | Status: DC
  Administered 2011-11-25 (×3): via INTRAVENOUS

## 2011-11-25 MED ORDER — SCOPOLAMINE 1 MG/3DAYS TD PT72
1.0000 | MEDICATED_PATCH | Freq: Once | TRANSDERMAL | Status: DC
  Filled 2011-11-25: qty 1

## 2011-11-25 MED ORDER — LANOLIN HYDROUS EX OINT: 1.0000 "application " | TOPICAL_OINTMENT | CUTANEOUS | Status: DC | PRN

## 2011-11-25 MED ORDER — DIPHENHYDRAMINE HCL 25 MG PO CAPS: 25.0000 mg | ORAL_CAPSULE | Freq: Four times a day (QID) | ORAL | Status: DC | PRN

## 2011-11-25 MED ORDER — LACTATED RINGERS IV SOLN
INTRAVENOUS | Status: DC | PRN
  Administered 2011-11-25: 13:00:00 via INTRAVENOUS

## 2011-11-25 MED ORDER — DIPHENHYDRAMINE HCL 50 MG/ML IJ SOLN: 12.5000 mg | INTRAMUSCULAR | Status: DC | PRN

## 2011-11-25 MED ORDER — FENTANYL CITRATE 0.05 MG/ML IJ SOLN
INTRAMUSCULAR | Status: AC
  Filled 2011-11-25: qty 2

## 2011-11-25 MED ORDER — KETOROLAC TROMETHAMINE 30 MG/ML IJ SOLN: 30.0000 mg | Freq: Four times a day (QID) | INTRAMUSCULAR | Status: AC | PRN

## 2011-11-25 MED ORDER — SENNOSIDES-DOCUSATE SODIUM 8.6-50 MG PO TABS
2.0000 | ORAL_TABLET | Freq: Every day | ORAL | Status: DC
  Administered 2011-11-26 – 2011-11-27 (×2): 2 via ORAL

## 2011-11-25 MED ORDER — ONDANSETRON HCL 4 MG/2ML IJ SOLN: 4.0000 mg | Freq: Three times a day (TID) | INTRAMUSCULAR | Status: DC | PRN

## 2011-11-25 MED ORDER — SODIUM CHLORIDE 0.9 % IJ SOLN: 3.0000 mL | INTRAMUSCULAR | Status: DC | PRN

## 2011-11-25 MED ORDER — MORPHINE SULFATE (PF) 0.5 MG/ML IJ SOLN
INTRAMUSCULAR | Status: DC | PRN
  Administered 2011-11-25: .1 mg via INTRATHECAL

## 2011-11-25 MED ORDER — PRENATAL MULTIVITAMIN CH
1.0000 | ORAL_TABLET | Freq: Every day | ORAL | Status: DC
  Administered 2011-11-26 – 2011-11-28 (×3): 1 via ORAL
  Filled 2011-11-25 (×3): qty 1

## 2011-11-25 MED ORDER — ONDANSETRON HCL 4 MG/2ML IJ SOLN: 4.0000 mg | INTRAMUSCULAR | Status: DC | PRN

## 2011-11-25 MED ORDER — EPHEDRINE 5 MG/ML INJ
INTRAVENOUS | Status: AC
  Filled 2011-11-25: qty 10

## 2011-11-25 MED ORDER — NALBUPHINE HCL 10 MG/ML IJ SOLN
5.0000 mg | INTRAMUSCULAR | Status: DC | PRN
  Filled 2011-11-25: qty 1

## 2011-11-25 MED ORDER — DIBUCAINE 1 % RE OINT: 1.0000 "application " | TOPICAL_OINTMENT | RECTAL | Status: DC | PRN

## 2011-11-25 MED ORDER — IBUPROFEN 600 MG PO TABS: 600.0000 mg | ORAL_TABLET | Freq: Four times a day (QID) | ORAL | Status: DC | PRN

## 2011-11-25 MED ORDER — CEFAZOLIN SODIUM 1-5 GM-% IV SOLN: 1.0000 g | INTRAVENOUS | Status: DC

## 2011-11-25 MED ORDER — OXYTOCIN 10 UNIT/ML IJ SOLN
INTRAMUSCULAR | Status: AC
  Filled 2011-11-25: qty 4

## 2011-11-25 MED ORDER — BISACODYL 10 MG RE SUPP: 10.0000 mg | Freq: Every day | RECTAL | Status: DC | PRN

## 2011-11-25 MED ORDER — METOCLOPRAMIDE HCL 5 MG/ML IJ SOLN: 10.0000 mg | Freq: Three times a day (TID) | INTRAMUSCULAR | Status: DC | PRN

## 2011-11-25 MED ORDER — PHENYLEPHRINE HCL 10 MG/ML IJ SOLN
INTRAMUSCULAR | Status: DC | PRN
  Administered 2011-11-25: 40 ug via INTRAVENOUS
  Administered 2011-11-25 (×3): 80 ug via INTRAVENOUS
  Administered 2011-11-25: 40 ug via INTRAVENOUS
  Administered 2011-11-25 (×6): 80 ug via INTRAVENOUS

## 2011-11-25 MED ORDER — DIPHENHYDRAMINE HCL 50 MG/ML IJ SOLN: 25.0000 mg | INTRAMUSCULAR | Status: DC | PRN

## 2011-11-25 MED ORDER — ONDANSETRON HCL 4 MG PO TABS: 4.0000 mg | ORAL_TABLET | ORAL | Status: DC | PRN

## 2011-11-25 MED ORDER — SCOPOLAMINE 1 MG/3DAYS TD PT72
MEDICATED_PATCH | TRANSDERMAL | Status: AC
  Administered 2011-11-25: 1.5 mg via TRANSDERMAL
  Filled 2011-11-25: qty 1

## 2011-11-25 MED ORDER — MEPERIDINE HCL 25 MG/ML IJ SOLN: 6.2500 mg | INTRAMUSCULAR | Status: DC | PRN

## 2011-11-25 MED ORDER — OXYCODONE-ACETAMINOPHEN 5-325 MG PO TABS
1.0000 | ORAL_TABLET | Freq: Four times a day (QID) | ORAL | Status: DC | PRN
  Administered 2011-11-26 – 2011-11-27 (×5): 1 via ORAL
  Filled 2011-11-25 (×6): qty 1

## 2011-11-25 MED ORDER — KETOROLAC TROMETHAMINE 60 MG/2ML IM SOLN
INTRAMUSCULAR | Status: AC
  Filled 2011-11-25: qty 2

## 2011-11-25 MED ORDER — OXYTOCIN 20 UNITS IN LACTATED RINGERS INFUSION - SIMPLE
INTRAVENOUS | Status: AC
  Administered 2011-11-25: 125 mL/h via INTRAVENOUS
  Filled 2011-11-25: qty 1000

## 2011-11-25 MED ORDER — SODIUM CHLORIDE 0.9 % IV SOLN
250.0000 mL | INTRAVENOUS | Status: DC
  Administered 2011-11-25: 1000 mL via INTRAVENOUS

## 2011-11-25 MED ORDER — HYDROMORPHONE HCL PF 1 MG/ML IJ SOLN: 0.2500 mg | INTRAMUSCULAR | Status: DC | PRN

## 2011-11-25 MED ORDER — FENTANYL CITRATE 0.05 MG/ML IJ SOLN
INTRAMUSCULAR | Status: DC | PRN
  Administered 2011-11-25: 15 ug via INTRATHECAL

## 2011-11-25 MED ORDER — SCOPOLAMINE 1 MG/3DAYS TD PT72
1.0000 | MEDICATED_PATCH | Freq: Once | TRANSDERMAL | Status: DC
  Administered 2011-11-25: 1.5 mg via TRANSDERMAL

## 2011-11-25 MED ORDER — SODIUM CHLORIDE 0.9 % IV SOLN: 1.0000 ug/kg/h | INTRAVENOUS | Status: DC | PRN

## 2011-11-25 MED ORDER — LACTATED RINGERS IV SOLN: INTRAVENOUS | Status: DC

## 2011-11-25 MED ORDER — IBUPROFEN 800 MG PO TABS
800.0000 mg | ORAL_TABLET | Freq: Three times a day (TID) | ORAL | Status: DC | PRN
  Administered 2011-11-26 – 2011-11-28 (×8): 800 mg via ORAL
  Filled 2011-11-25 (×8): qty 1

## 2011-11-25 MED ORDER — MORPHINE SULFATE 0.5 MG/ML IJ SOLN
INTRAMUSCULAR | Status: AC
  Filled 2011-11-25: qty 10

## 2011-11-25 MED ORDER — SODIUM CHLORIDE 0.9 % IJ SOLN: 3.0000 mL | Freq: Two times a day (BID) | INTRAMUSCULAR | Status: DC

## 2011-11-25 MED ORDER — SIMETHICONE 80 MG PO CHEW
80.0000 mg | CHEWABLE_TABLET | Freq: Three times a day (TID) | ORAL | Status: DC
  Administered 2011-11-26 – 2011-11-28 (×10): 80 mg via ORAL

## 2011-11-25 MED ORDER — MENTHOL 3 MG MT LOZG: 1.0000 | LOZENGE | OROMUCOSAL | Status: DC | PRN

## 2011-11-25 MED ORDER — PHENYLEPHRINE 40 MCG/ML (10ML) SYRINGE FOR IV PUSH (FOR BLOOD PRESSURE SUPPORT)
PREFILLED_SYRINGE | INTRAVENOUS | Status: AC
  Filled 2011-11-25: qty 15

## 2011-11-25 MED ORDER — KETOROLAC TROMETHAMINE 60 MG/2ML IM SOLN
60.0000 mg | Freq: Once | INTRAMUSCULAR | Status: AC | PRN
  Administered 2011-11-25: 60 mg via INTRAMUSCULAR

## 2011-11-25 MED ORDER — DIPHENHYDRAMINE HCL 25 MG PO CAPS: 25.0000 mg | ORAL_CAPSULE | ORAL | Status: DC | PRN

## 2011-11-25 MED ORDER — FLEET ENEMA 7-19 GM/118ML RE ENEM: 1.0000 | ENEMA | Freq: Every day | RECTAL | Status: DC | PRN

## 2011-11-25 MED ORDER — WITCH HAZEL-GLYCERIN EX PADS: 1.0000 "application " | MEDICATED_PAD | CUTANEOUS | Status: DC | PRN

## 2011-11-25 MED ORDER — OXYTOCIN 20 UNITS IN LACTATED RINGERS INFUSION - SIMPLE
INTRAVENOUS | Status: DC | PRN
  Administered 2011-11-25: 20 [IU] via INTRAVENOUS

## 2011-11-25 MED ORDER — EPHEDRINE SULFATE 50 MG/ML IJ SOLN
INTRAMUSCULAR | Status: DC | PRN
  Administered 2011-11-25: 15 mg via INTRAVENOUS
  Administered 2011-11-25 (×4): 5 mg via INTRAVENOUS

## 2011-11-25 MED ORDER — OXYTOCIN 20 UNITS IN LACTATED RINGERS INFUSION - SIMPLE
125.0000 mL/h | INTRAVENOUS | Status: AC
  Administered 2011-11-25: 125 mL/h via INTRAVENOUS

## 2011-11-25 MED ORDER — CEFAZOLIN SODIUM 1-5 GM-% IV SOLN
INTRAVENOUS | Status: AC
  Administered 2011-11-25: 1 g via INTRAVENOUS
  Filled 2011-11-25: qty 50

## 2011-11-25 MED ORDER — ZOLPIDEM TARTRATE 5 MG PO TABS: 5.0000 mg | ORAL_TABLET | Freq: Every evening | ORAL | Status: DC | PRN

## 2011-11-25 MED ORDER — ONDANSETRON HCL 4 MG/2ML IJ SOLN
INTRAMUSCULAR | Status: DC | PRN
  Administered 2011-11-25: 4 mg via INTRAVENOUS

## 2011-11-25 MED ORDER — SIMETHICONE 80 MG PO CHEW: 80.0000 mg | CHEWABLE_TABLET | ORAL | Status: DC | PRN

## 2011-11-25 MED ORDER — MEASLES, MUMPS & RUBELLA VAC ~~LOC~~ INJ: 0.5000 mL | INJECTION | Freq: Once | SUBCUTANEOUS | Status: DC

## 2011-11-25 MED ORDER — TETANUS-DIPHTH-ACELL PERTUSSIS 5-2.5-18.5 LF-MCG/0.5 IM SUSP: 0.5000 mL | Freq: Once | INTRAMUSCULAR | Status: DC

## 2011-11-25 MED ORDER — BUPIVACAINE IN DEXTROSE 0.75-8.25 % IT SOLN
INTRATHECAL | Status: DC | PRN
  Administered 2011-11-25: 1.5 mg via INTRATHECAL

## 2011-11-25 SURGICAL SUPPLY — 34 items
CLIP FILSHIE TUBAL LIGA STRL (Clip) ×1 IMPLANT
CLOTH BEACON ORANGE TIMEOUT ST (SAFETY) ×2 IMPLANT
DRESSING TELFA 8X3 (GAUZE/BANDAGES/DRESSINGS) IMPLANT
DRSG COVADERM 4X10 (GAUZE/BANDAGES/DRESSINGS) ×1 IMPLANT
ELECT REM PT RETURN 9FT ADLT (ELECTROSURGICAL) ×2
ELECTRODE REM PT RTRN 9FT ADLT (ELECTROSURGICAL) ×1 IMPLANT
EXTRACTOR VACUUM M CUP 4 TUBE (SUCTIONS) ×1 IMPLANT
GAUZE SPONGE 4X4 12PLY STRL LF (GAUZE/BANDAGES/DRESSINGS) ×4 IMPLANT
GLOVE BIO SURGEON STRL SZ7 (GLOVE) ×4 IMPLANT
GLOVE BIOGEL PI IND STRL 7.0 (GLOVE) IMPLANT
GLOVE BIOGEL PI INDICATOR 7.0 (GLOVE) ×1
GLOVE SURG SS PI 7.0 STRL IVOR (GLOVE) ×1 IMPLANT
GOWN PREVENTION PLUS LG XLONG (DISPOSABLE) ×7 IMPLANT
KIT ABG SYR 3ML LUER SLIP (SYRINGE) ×1 IMPLANT
NDL HYPO 25X5/8 SAFETYGLIDE (NEEDLE) ×1 IMPLANT
NEEDLE HYPO 25X5/8 SAFETYGLIDE (NEEDLE) ×2 IMPLANT
NS IRRIG 1000ML POUR BTL (IV SOLUTION) ×2 IMPLANT
PACK C SECTION WH (CUSTOM PROCEDURE TRAY) ×2 IMPLANT
PAD ABD 7.5X8 STRL (GAUZE/BANDAGES/DRESSINGS) ×1 IMPLANT
SLEEVE SCD COMPRESS KNEE MED (MISCELLANEOUS) ×1 IMPLANT
SPONGE GAUZE 4X4 12PLY (GAUZE/BANDAGES/DRESSINGS) ×1 IMPLANT
STRIP CLOSURE SKIN 1/2X4 (GAUZE/BANDAGES/DRESSINGS) ×2 IMPLANT
SUT CHROMIC 0 CTX 36 (SUTURE) ×6 IMPLANT
SUT CHROMIC 1 CTX 36 (SUTURE) ×4 IMPLANT
SUT MON AB 4-0 PS1 27 (SUTURE) ×2 IMPLANT
SUT PDS AB 0 CT1 27 (SUTURE) ×4 IMPLANT
SUT VIC AB 2-0 SH 27 (SUTURE) ×2
SUT VIC AB 2-0 SH 27XBRD (SUTURE) IMPLANT
SUT VIC AB 3-0 CT1 27 (SUTURE) ×4
SUT VIC AB 3-0 CT1 TAPERPNT 27 (SUTURE) ×2 IMPLANT
TAPE CLOTH SURG 4X10 WHT LF (GAUZE/BANDAGES/DRESSINGS) ×1 IMPLANT
TOWEL OR 17X24 6PK STRL BLUE (TOWEL DISPOSABLE) ×4 IMPLANT
TRAY FOLEY CATH 14FR (SET/KITS/TRAYS/PACK) ×2 IMPLANT
WATER STERILE IRR 1000ML POUR (IV SOLUTION) ×2 IMPLANT

## 2011-11-25 NOTE — Anesthesia Procedure Notes (Signed)
Spinal  Patient location during procedure: OR Start time: 11/25/2011 12:59 PM Staffing Performed by: anesthesiologist  Preanesthetic Checklist Completed: patient identified, site marked, surgical consent, pre-op evaluation, timeout performed, IV checked, risks and benefits discussed and monitors and equipment checked Spinal Block Patient position: sitting Prep: site prepped and draped and DuraPrep Patient monitoring: heart rate, cardiac monitor, continuous pulse ox and blood pressure Approach: midline Location: L3-4 Injection technique: single-shot Needle Needle type: Sprotte  Needle gauge: 24 G Needle length: 9 cm Assessment Sensory level: T4 Additional Notes Clear free flow CSF on first attempt.  No paresthesia.  Patient tolerated procedure well.  Jasmine December, MD

## 2011-11-25 NOTE — Transfer of Care (Signed)
Immediate Anesthesia Transfer of Care Note  Patient: [name]  Procedure(s) Performed: Procedure(s) (LRB): CESAREAN SECTION WITH BILATERAL TUBAL LIGATION (Bilateral)  Patient Location: PACU  Anesthesia Type: Spinal  Level of Consciousness: awake  Airway & Oxygen Therapy: Patient Spontanous Breathing  Post-op Assessment: Report given to PACU RN and Post -op Vital signs reviewed and stable  Post vital signs: stable  Complications: No apparent anesthesia complications

## 2011-11-25 NOTE — Progress Notes (Signed)
The patient was re-examined with no change in status 

## 2011-11-25 NOTE — Anesthesia Preprocedure Evaluation (Addendum)
Anesthesia Evaluation  Patient identified by MRN, date of birth, ID band Patient awake    Reviewed: Allergy & Precautions, H&P , Patient's Chart, lab work & pertinent test results  Airway Mallampati: II TM Distance: >3 FB Neck ROM: full    Dental No notable dental hx.    Pulmonary  breath sounds clear to auscultation  Pulmonary exam normal       Cardiovascular Exercise Tolerance: Good Rhythm:regular Rate:Normal     Neuro/Psych    GI/Hepatic   Endo/Other    Renal/GU      Musculoskeletal   Abdominal   Peds  Hematology   Anesthesia Other Findings Hx of Nephrotic syndrome: Not active  Reproductive/Obstetrics                           Anesthesia Physical Anesthesia Plan  ASA: II  Anesthesia Plan: Spinal   Post-op Pain Management:    Induction:   Airway Management Planned:   Additional Equipment:   Intra-op Plan:   Post-operative Plan:   Informed Consent: I have reviewed the patients History and Physical, chart, labs and discussed the procedure including the risks, benefits and alternatives for the proposed anesthesia with the patient or authorized representative who has indicated his/her understanding and acceptance.   Dental Advisory Given  Plan Discussed with: CRNA  Anesthesia Plan Comments: (Lab work confirmed with CRNA in room. Platelets okay. Discussed spinal anesthetic, and patient consents to the procedure:  included risk of possible headache,backache, failed block, allergic reaction, and nerve injury. This patient was asked if she had any questions or concerns before the procedure started. )        Anesthesia Quick Evaluation

## 2011-11-25 NOTE — Op Note (Signed)
Preoperative diagnosis: 1. 36 week IUP 2. Placenta previa 3. History of fetal parvovirus 4. Request permanent sterilization  Postoperative diagnosis: Same  Procedure: Primary low transverse cesarean section, Filshie clip tubal ligation  Surgeon: Marcelle Overlie  Assistant: McComb  EBL: 800 cc  Complications: None  Drains: Foley catheter  Specimens removed: Placenta to pathology  Procedure and findings:  Patient was taken to the operating room after an adequate level of spinal anesthetic was obtained with the patient in left tilt position the abdomen was prepped and draped in the usual fashion for cesarean section. Appropriate timeout was taken and was correct. Foley catheter positioned draining clear urine. Transverse Pfannenstiel incision was made 2 finger restaurant symphysis carried down to the fascia which was incised and extended transversely. Rectus muscle divided in the midline peritoneum entered superiorly without incident and extended in a vertical fashion. The vesicouterine serosa was incised and the bladder was bluntly and sharply dissected below, bladder blade repositioned transverse incision made lower segment which is eccentric bandage scissors the lower edge of the placenta was encountered, this could be positioned out of the way, to see membranes which were ruptured clear fluid, VE was then used to gently delivered the vertex, female infant Apgars 8 and a cord pH was sent and is pending the infant was suctioned cord clamped and passed the pediatric team for further care.  The placenta was then delivered manually intact sent to pathology, uterus exteriorized cavity wiped clean pain with a laparotomy pack closure obtained with a first layer of 0 chromic in a locked fashion followed by Dimetane layer of chromic the bladder flap area was intact and hemostatic the uterine incision was hemostatic that point. Bilateral tubes and ovaries were normal Filshie clip was applied a right angle 3 cm  from the cornu at each site completely engulfing the tube once this was completed the uterus is then returned its intra-abdominal position. Prior to closure sponge denies precast reported as correct x2 peritoneum closed the running 2-0 Vicryl suture. 2-0 Vicryl interrupted sutures used to reapproximate the rectus muscles in the midline. 0 PDS was then used from laterally to midline inside close the fascia subcutaneous tissue was hemostatic 4-0 Monocryl subarticular suture with Steri-Strips applied clear urine noted in the case she did receive Ancef 1 g IV and Pitocin IV after the cord was clamped. Mother and baby doing well that point  Dictated with [facility]  [name].D.

## 2011-11-25 NOTE — Anesthesia Postprocedure Evaluation (Signed)
Anesthesia Post Note  Patient: [name]  Procedure(s) Performed: Procedure(s) (LRB): CESAREAN SECTION WITH BILATERAL TUBAL LIGATION (Bilateral)  Anesthesia type: Spinal  Patient location: PACU  Post pain: Pain level controlled  Post assessment: Post-op Vital signs reviewed  Last Vitals:  Filed Vitals:   11/25/11 1415  BP:   Pulse:   Temp:   Resp: 16    Post vital signs: Reviewed  Level of consciousness: awake  Complications: No apparent anesthesia complications

## 2011-11-26 LAB — CBC
HCT: 24.7 % — ABNORMAL LOW (ref 36.0–46.0)
MCHC: 34.8 g/dL (ref 30.0–36.0)
MCV: 91.8 fL (ref 78.0–100.0)
RDW: 13 % (ref 11.5–15.5)

## 2011-11-26 MED ORDER — RHO D IMMUNE GLOBULIN 1500 UNIT/2ML IJ SOLN
300.0000 ug | Freq: Once | INTRAMUSCULAR | Status: AC
  Administered 2011-11-26: 300 ug via INTRAMUSCULAR
  Filled 2011-11-26: qty 2

## 2011-11-26 MED ORDER — FERROUS SULFATE 325 (65 FE) MG PO TABS
325.0000 mg | ORAL_TABLET | Freq: Two times a day (BID) | ORAL | Status: DC
  Administered 2011-11-26 – 2011-11-28 (×4): 325 mg via ORAL
  Filled 2011-11-26 (×4): qty 1

## 2011-11-26 NOTE — Progress Notes (Signed)
Subjective: Postpartum Day 1: Cesarean Delivery Patient reports tolerating PO and no problems voiding.  C/o R shoulder pain  Objective: Vital signs in last 24 hours: Temp:  [97.5 F (36.4 C)-98.9 F (37.2 C)] 98.1 F (36.7 C) (06/12 0440) Pulse Rate:  [62-120] 98  (06/12 0440) Resp:  [16-19] 18  (06/12 0440) BP: (90-113)/(49-74) 105/65 mmHg (06/12 0440) SpO2:  [96 %-100 %] 98 % (06/12 0440)  Physical Exam:  General: alert and cooperative Lochia: appropriate Uterine Fundus: firm Incision: abd dressing CDI DVT Evaluation: No evidence of DVT seen on physical exam. Foley discontinued , has voided x1 adequately Abd soft + BS  Basename 11/26/11 0505 11/25/11 1155  HGB 8.6* 12.4  HCT 24.7* 35.7*    Assessment/Plan: Status post Cesarean section. Doing well postoperatively.  Continue current care CBC in am.  Meyer Arora G 11/26/2011, 8:08 AM

## 2011-11-26 NOTE — Addendum Note (Signed)
Addendum  created 11/26/11 1006 by Suella Grove, CRNA   Modules edited:Notes Section

## 2011-11-26 NOTE — Anesthesia Postprocedure Evaluation (Signed)
  Anesthesia Post-op Note  Patient: [name]  Procedure(s) Performed: Procedure(s) (LRB): CESAREAN SECTION WITH BILATERAL TUBAL LIGATION (Bilateral)  Patient Location: Mother/Baby  Anesthesia Type: Spinal  Level of Consciousness: awake  Airway and Oxygen Therapy: Patient Spontanous Breathing  Post-op Pain: none  Post-op Assessment: Patient's Cardiovascular Status Stable, Respiratory Function Stable, Patent Airway, No signs of Nausea or vomiting, Adequate PO intake, Pain level controlled, No headache, No backache, No residual numbness and No residual motor weakness  Post-op Vital Signs: Reviewed and stable  Complications: No apparent anesthesia complications

## 2011-11-27 LAB — CBC
Platelets: 172 10*3/uL (ref 150–400)
RBC: 2.58 MIL/uL — ABNORMAL LOW (ref 3.87–5.11)
WBC: 9.3 10*3/uL (ref 4.0–10.5)

## 2011-11-27 LAB — RH IG WORKUP (INCLUDES ABO/RH): Unit division: 0

## 2011-11-27 MED ORDER — OXYCODONE-ACETAMINOPHEN 5-325 MG PO TABS
1.0000 | ORAL_TABLET | ORAL | Status: DC | PRN
  Administered 2011-11-27: 1 via ORAL
  Administered 2011-11-27 (×2): 2 via ORAL
  Administered 2011-11-28 (×2): 1 via ORAL
  Administered 2011-11-28: 2 via ORAL
  Administered 2011-11-28: 1 via ORAL
  Filled 2011-11-27: qty 2
  Filled 2011-11-27 (×3): qty 1
  Filled 2011-11-27 (×2): qty 2

## 2011-11-27 NOTE — Progress Notes (Signed)
Subjective: Postpartum Day 2: Cesarean Delivery Patient reports tolerating PO, + flatus and no problems voiding.    Objective: Vital signs in last 24 hours: Temp:  [97.8 F (36.6 C)-98.4 F (36.9 C)] 97.8 F (36.6 C) (06/13 0605) Pulse Rate:  [74-92] 74  (06/13 0605) Resp:  [19-20] 20  (06/13 0605) BP: (96-102)/(64-71) 96/64 mmHg (06/13 0605) SpO2:  [98 %] 98 % (06/13 7829)  Physical Exam:  General: alert and cooperative Lochia: appropriate Uterine Fundus: firm Incision: healing well DVT Evaluation: No evidence of DVT seen on physical exam.   Basename 11/27/11 0505 11/26/11 0505  HGB 8.2* 8.6*  HCT 24.0* 24.7*    Assessment/Plan: Status post Cesarean section. Doing well postoperatively.  Continue current care.  Derricka Mertz G 11/27/2011, 8:47 AM

## 2011-11-28 LAB — TYPE AND SCREEN
Unit division: 0
Unit division: 0

## 2011-11-28 MED ORDER — FERROUS SULFATE 325 (65 FE) MG PO TABS: 325.0000 mg | ORAL_TABLET | Freq: Two times a day (BID) | ORAL | Status: DC

## 2011-11-28 MED ORDER — OXYCODONE-ACETAMINOPHEN 5-325 MG PO TABS: 1.0000 | ORAL_TABLET | Freq: Four times a day (QID) | ORAL | Status: AC | PRN

## 2011-11-28 MED ORDER — IBUPROFEN 800 MG PO TABS: 800.0000 mg | ORAL_TABLET | Freq: Three times a day (TID) | ORAL | Status: AC | PRN

## 2011-11-28 NOTE — Progress Notes (Signed)
Subjective: Postpartum Day 3: Cesarean Delivery Patient reports tolerating PO.    Objective: Vital signs in last 24 hours: Temp:  [97.9 F (36.6 C)-98.7 F (37.1 C)] 98.1 F (36.7 C) (06/14 0653) Pulse Rate:  [78-90] 78  (06/14 0653) Resp:  [18] 18  (06/14 0653) BP: (91-104)/(56-63) 91/63 mmHg (06/14 0653) SpO2:  [96 %] 96 % (06/13 1436)  Physical Exam:  General: alert Lochia: appropriate Uterine Fundus: firm Incision: healing well DVT Evaluation: No evidence of DVT seen on physical exam.   Basename 11/27/11 0505 11/26/11 0505  HGB 8.2* 8.6*  HCT 24.0* 24.7*    Assessment/Plan: Status post Cesarean section. Doing well postoperatively.  DC>>office 7-10 days.  [name] M 11/28/2011, 8:08 AM

## 2011-11-28 NOTE — Discharge Summary (Signed)
Obstetric Discharge Summary Reason for Admission: cesarean section Prenatal Procedures: PUBS Intrapartum Procedures: cesarean: low cervical, transverse and tubal ligation Postpartum Procedures: none Complications-Operative and Postpartum: none Hemoglobin  Date Value Range Status  11/27/2011 8.2* 12.0 - 15.0 g/dL Final     HCT  Date Value Range Status  11/27/2011 24.0* 36.0 - 46.0 % Final    Physical Exam:  General: alert Lochia: appropriate Uterine Fundus: firm Incision: healing well DVT Evaluation: No evidence of DVT seen on physical exam.  Discharge Diagnoses: Term Pregnancy-delivered and History parvovirus, placenta previa  Discharge Information: Date: 11/28/2011 Activity: pelvic rest Diet: routine Medications: PNV, Ibuprofen, Iron and Percocet Condition: stable Instructions: refer to practice specific booklet Discharge to: home Follow-up Information    Follow up with Meriel Pica, MD. Call in 7 days.   Contact information:   [address] [place] [address] [phone]          Newborn Data: Live born female  Birth Weight: 5 lb 11.9 oz (2605 g) APGAR: 7, 8  Home with mother.  Qunicy Higinbotham M 11/28/2011, 8:11 AM

## 2012-01-31 NOTE — Progress Notes (Signed)
MFM Note  See ultrasound report dated 09/04/11 for consult.

## 2012-01-31 NOTE — Addendum Note (Signed)
Encounter addended by: Particia Nearing, MD on: 01/31/2012 11:17 PM<BR>     Documentation filed: Notes Section

## 2012-03-01 ENCOUNTER — Ambulatory Visit: Payer: 59 | Admitting: Family Medicine

## 2012-03-02 ENCOUNTER — Ambulatory Visit (INDEPENDENT_AMBULATORY_CARE_PROVIDER_SITE_OTHER): Payer: 59 | Admitting: Family Medicine

## 2012-03-02 ENCOUNTER — Encounter: Payer: Self-pay | Admitting: Family Medicine

## 2012-03-02 VITALS — BP 100/64 | Temp 98.0°F | Wt 150.0 lb

## 2012-03-02 DIAGNOSIS — L259 Unspecified contact dermatitis, unspecified cause: Secondary | ICD-10-CM

## 2012-03-02 DIAGNOSIS — L309 Dermatitis, unspecified: Secondary | ICD-10-CM

## 2012-03-02 DIAGNOSIS — M722 Plantar fascial fibromatosis: Secondary | ICD-10-CM

## 2012-03-02 MED ORDER — TRIAMCINOLONE ACETONIDE 0.1 % EX CREA: TOPICAL_CREAM | Freq: Two times a day (BID) | CUTANEOUS | Status: DC

## 2012-03-02 NOTE — Progress Notes (Signed)
  Subjective:    Patient ID: [name], female    DOB: 03-04-1976, 36 y.o.   MRN: [id number]  HPI  Seen for the following issues  Painful right heel. No injury. Pain worse after prolonged periods of sitting or first thing in the morning. No erythema or swelling. No alleviating factors. Pain is relatively mild. Worse with walking and ambulation and not at rest. No Achilles pain.  Pruritic rash right anterior thigh. Present for several weeks. No other rash noted. No associated pain. No pustules. She's not tried anything topically. Not aware of any clear precipitants.   Review of Systems  Constitutional: Negative for fever and chills.  Skin: Positive for rash.  Hematological: Negative for adenopathy.       Objective:   Physical Exam  Constitutional: She appears well-developed and well-nourished.  Cardiovascular: Normal rate and regular rhythm.   Musculoskeletal:       Right foot reveals tenderness over the plantar fascial region. No Achilles tenderness. No bony tenderness right foot. No edema. No erythema. No warmth.  Skin:       Patient is erythematous minimally raised rash that blanches to pressure right anterior thigh and covering most of the anterior thigh. No pustules. A few excoriations. No vesicles. No cellulitis changes. Not warm to touch.          Assessment & Plan:  #1 plantar fasciitis right foot. We discussed stretches, ice, heel cup or gel insert. She's not interested in considering cortisone injection this point. #2 nonspecific dermatitis right anterior thigh. Triamcinolone 0.1% cream use twice a day. Touch base 2-3 weeks if no improvement

## 2012-03-02 NOTE — Patient Instructions (Addendum)

## 2012-06-03 ENCOUNTER — Ambulatory Visit (INDEPENDENT_AMBULATORY_CARE_PROVIDER_SITE_OTHER): Payer: BC Managed Care – PPO | Admitting: Family Medicine

## 2012-06-03 DIAGNOSIS — Z23 Encounter for immunization: Secondary | ICD-10-CM

## 2012-07-14 ENCOUNTER — Ambulatory Visit: Payer: BC Managed Care – PPO | Admitting: Family Medicine

## 2012-07-15 ENCOUNTER — Ambulatory Visit (INDEPENDENT_AMBULATORY_CARE_PROVIDER_SITE_OTHER): Payer: BC Managed Care – PPO | Admitting: Family Medicine

## 2012-07-15 ENCOUNTER — Encounter: Payer: Self-pay | Admitting: Family Medicine

## 2012-07-15 VITALS — BP 110/70 | Temp 98.8°F

## 2012-07-15 DIAGNOSIS — J019 Acute sinusitis, unspecified: Secondary | ICD-10-CM

## 2012-07-15 MED ORDER — HYDROCODONE-HOMATROPINE 5-1.5 MG/5ML PO SYRP: 5.0000 mL | ORAL_SOLUTION | Freq: Four times a day (QID) | ORAL | Status: AC | PRN

## 2012-07-15 MED ORDER — AMOXICILLIN 875 MG PO TABS: 875.0000 mg | ORAL_TABLET | Freq: Two times a day (BID) | ORAL | Status: AC

## 2012-07-15 NOTE — Progress Notes (Signed)
  Subjective:    Patient ID: [name], female    DOB: 1976/04/22, 37 y.o.   MRN: [id number]  HPI  Acute visit.  2 weeks of sinus congestion, headaches Maxillary facial pressure-bilateral.  Increased malaise Sore throat off and on. Increased cough-productive. Possible mild wheezing off and on. Denies any nausea or vomiting No known drug allergies  Cough especially bothersome at night. Not relieved with Mucinex   Review of Systems  Constitutional: Positive for fatigue. Negative for fever and chills.  HENT: Positive for congestion and sore throat.   Respiratory: Positive for cough.        Objective:   Physical Exam  Constitutional: She appears well-developed and well-nourished.  HENT:  Right Ear: External ear normal.  Left Ear: External ear normal.  Mouth/Throat: Oropharynx is clear and moist.  Neck: Neck supple. No thyromegaly present.  Cardiovascular: Normal rate and regular rhythm.   Pulmonary/Chest: Effort normal and breath sounds normal. No respiratory distress. She has no wheezes. She has no rales.  Lymphadenopathy:    She has no cervical adenopathy.          Assessment & Plan:  Acute sinusitis. Amoxicillin 875 mg twice daily for 10 days. Hycodan cough syrup for limited nighttime use only for severe cough

## 2012-07-15 NOTE — Patient Instructions (Addendum)

## 2012-12-16 IMAGING — US US OB MCA DOPPLER
1 series · 13 of 15 positions shown · non-contrast
Comparison: none

[Series 1: us ob mca doppler · 0.24mm/px · 13 of 15 slices shown]
[im 1/15]
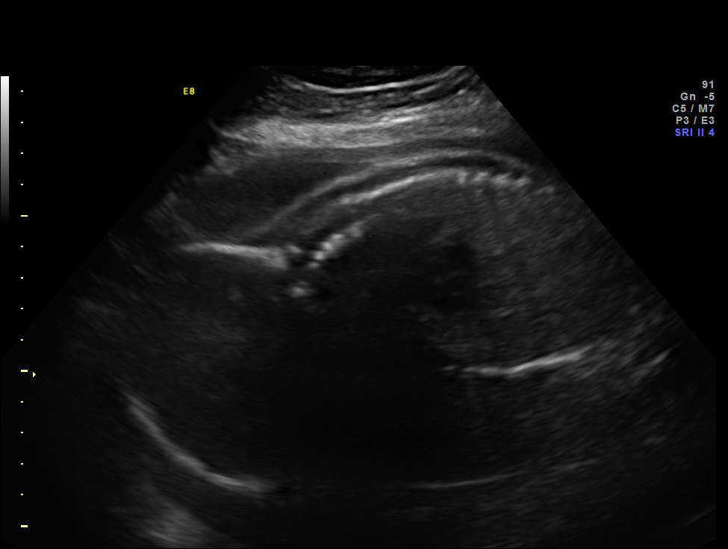
[im 2/15]
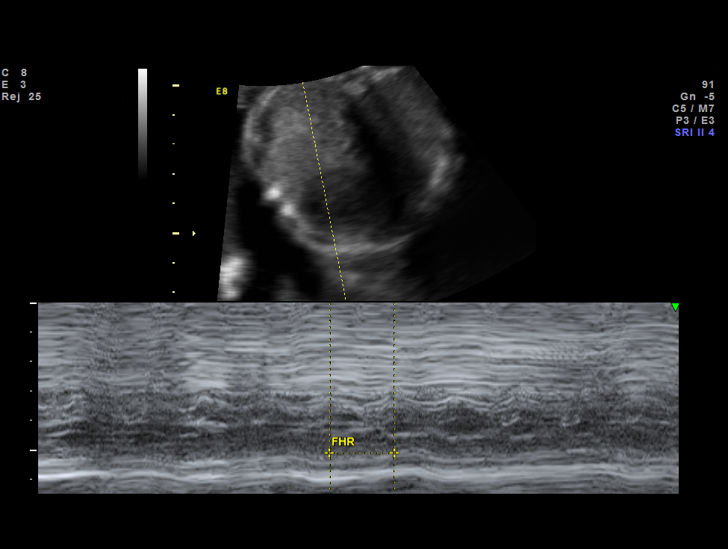
[im 3/15]
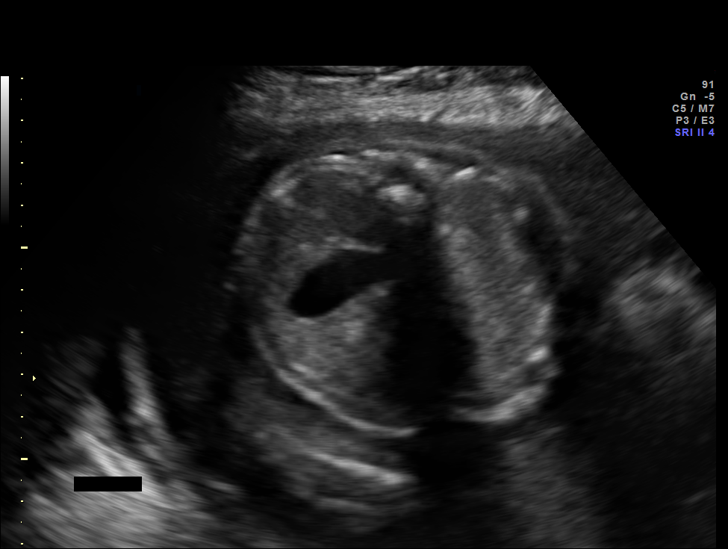
[im 5/15]
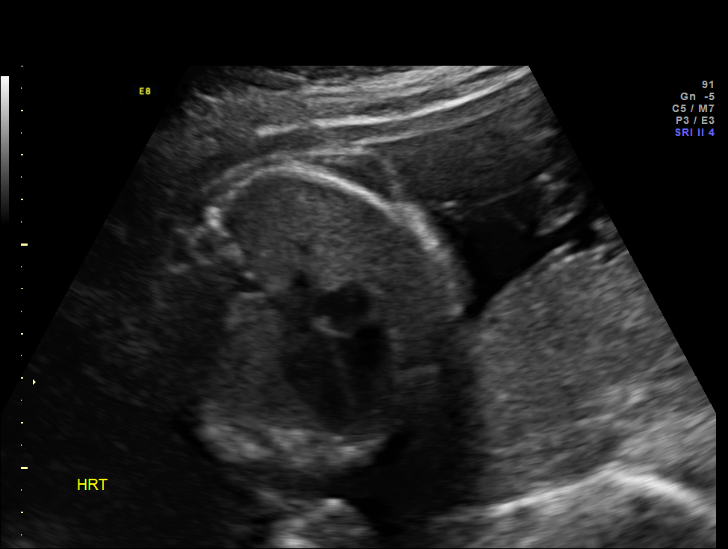
[im 6/15]
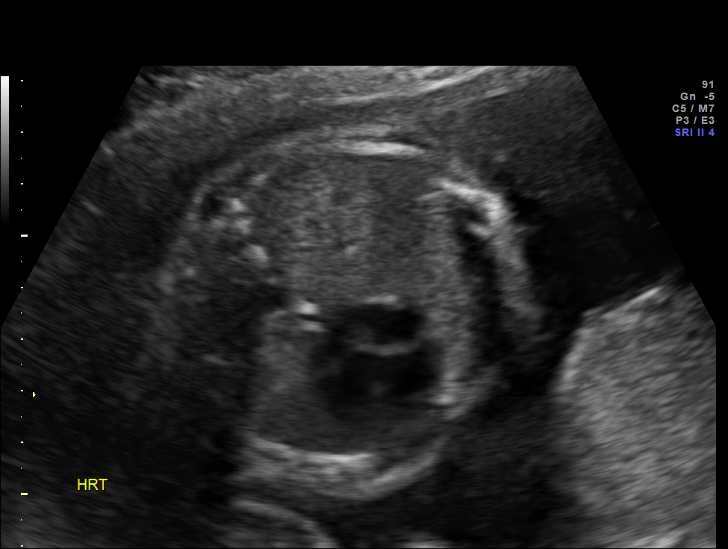
[im 7/15]
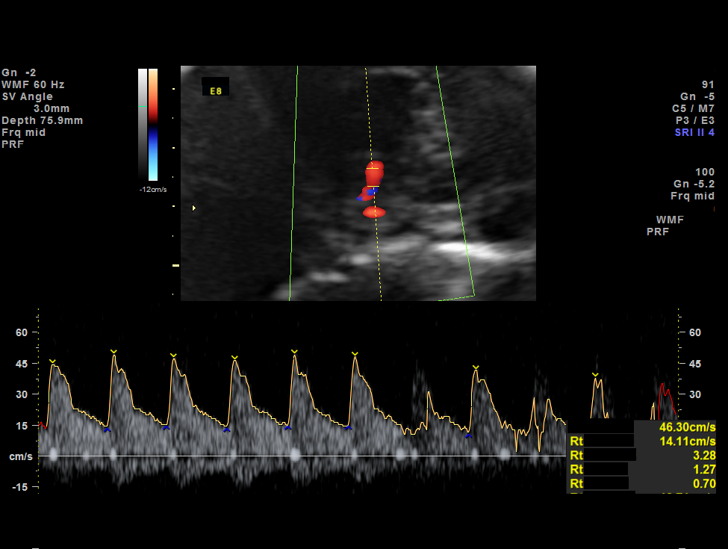
[im 8/15]
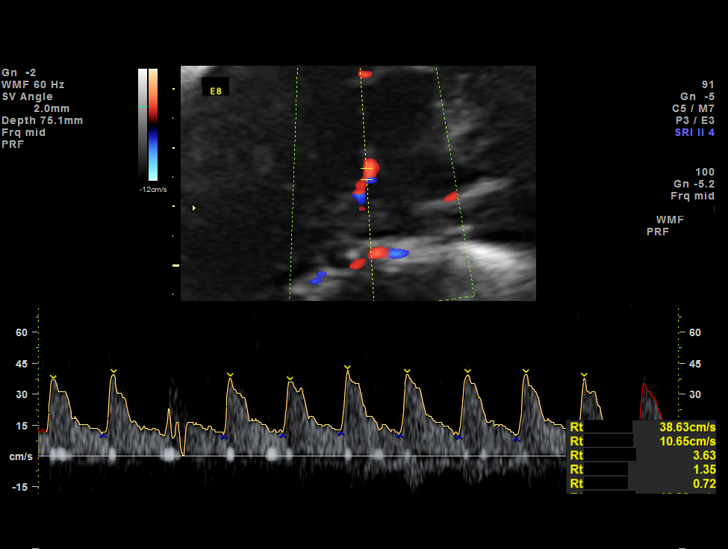
[im 9/15]
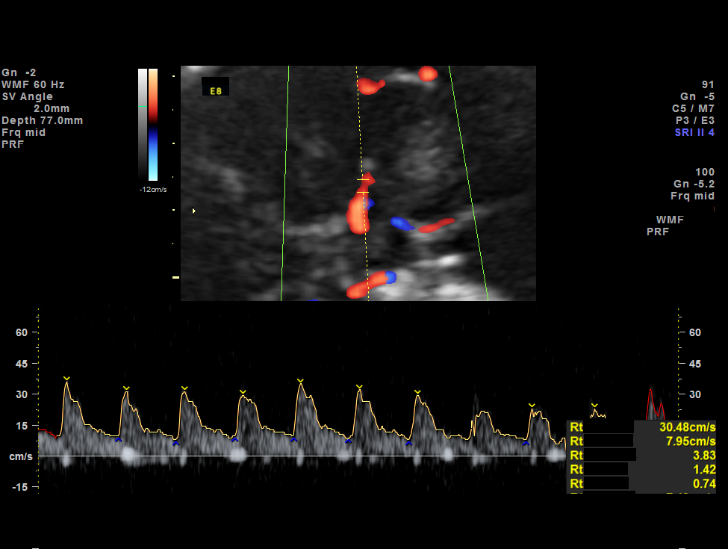
[im 10/15]
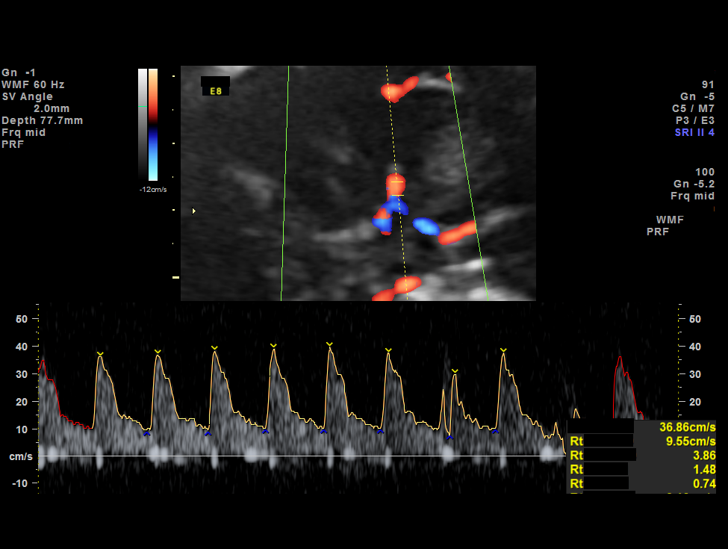
[im 11/15]
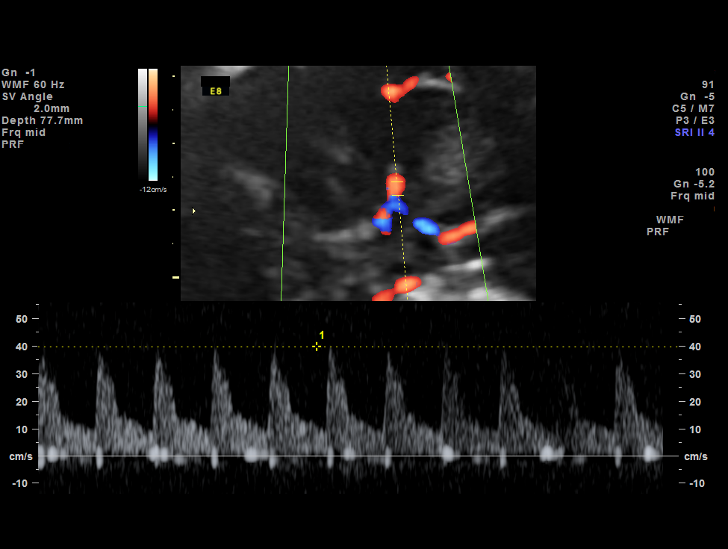
[im 13/15]
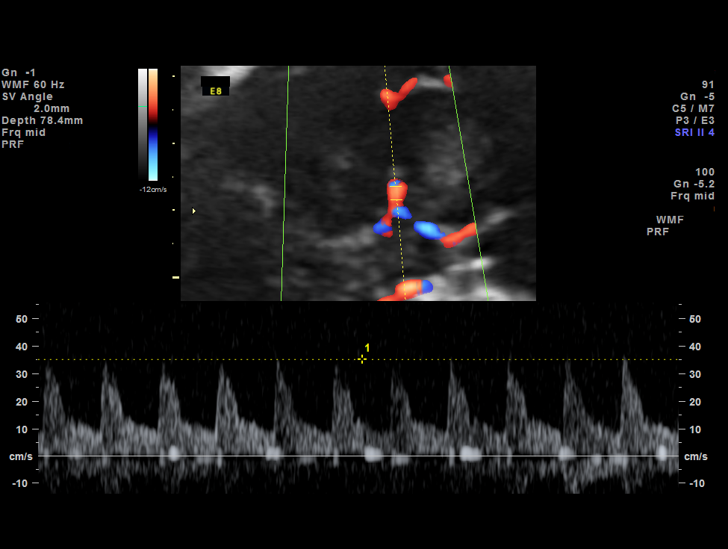
[im 14/15]
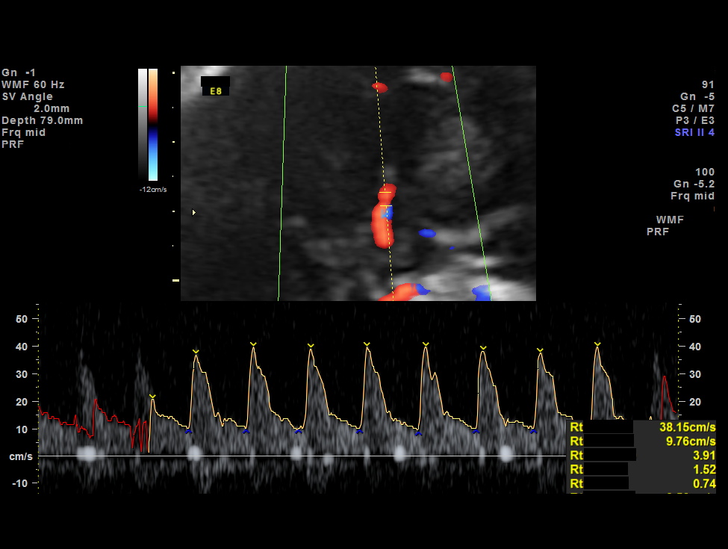
[im 15/15]
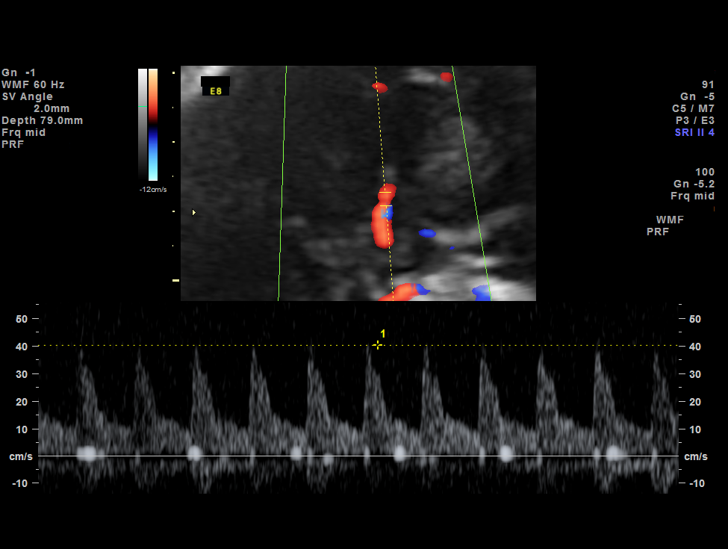

[13 of 15 positions shown; findings below may reference images not displayed]

OBSTETRICS REPORT
                      (Signed Final 09/26/2011 [DATE])

 Order#:         [id number]
Procedures

 US MCA DOPPLER                                        76821.0
Indications

 Maternal parvovirus infection
 Placenta previa/Low lying: No bleeding
 PPROM
 Assess fetal well being
Fetal Evaluation

 Fetal Heart Rate:  155                          bpm
 Cardiac Activity:  Observed
 Presentation:      Breech
 Placenta:          Left lateral, complete
                    previa
 P. Cord            Previously Visualized
 Insertion:

 Amniotic Fluid
 AFI FV:      Subjectively within normal limits
Gestational Age

 LMP:           27w 0d        Date:  03/21/11                 EDD:   12/26/11
 Best:          27w 0d     Det. By:  LMP  (03/21/11)          EDD:   12/26/11
Doppler - Fetal Vessels

 Middle Cerebral Artery
 S/D:   3.58                           RI:
 PI:    1.35        < 2.5  %tile       PSV:       46.3    cm/s      1.31  MoM

Comments

 MCA peak systolic velocity is 1.31 MoM today, which is
 improved from 1.48 MoM earlier this week.  This value is also
 similar to those from weeks past.  Thus there is currently a
 low risk of severe anemia and the patient will return next
 [REDACTED] for a repeat MCA dopplers.  If this shows MCA
 values similar to today testing can revert back to weekly
 instead of twice weekly.
Impression

 Single living intrauterine pregnancy at 27 weeks 0 days.
 MCA peak systolic velocity mildly elevated (1.31 MoM).
Recommendations

 MCA dopplers on [REDACTED].

 questions or concerns.
                Rusch, Aquarius

## 2012-12-29 IMAGING — US US OB MCA DOPPLER
1 series · 13 of 25 positions shown · non-contrast
Comparison: none

[Series 1: us ob mca doppler · 0.23mm/px · 13 of 25 slices shown]
[im 1/25]
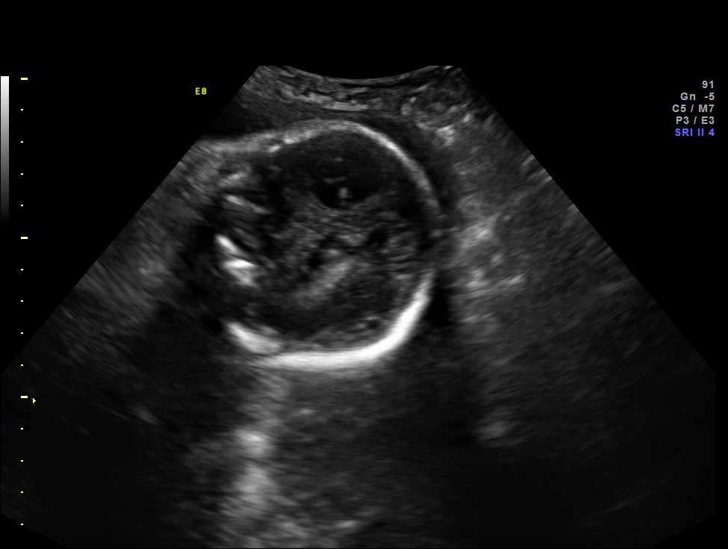
[im 3/25]
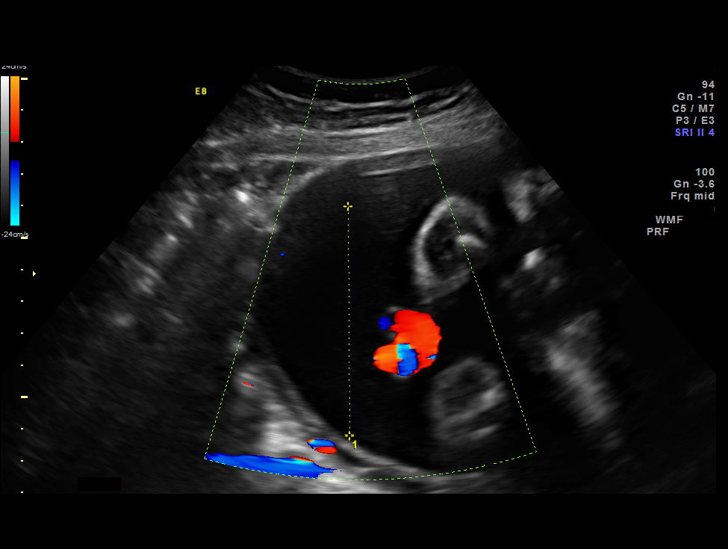
[im 5/25]
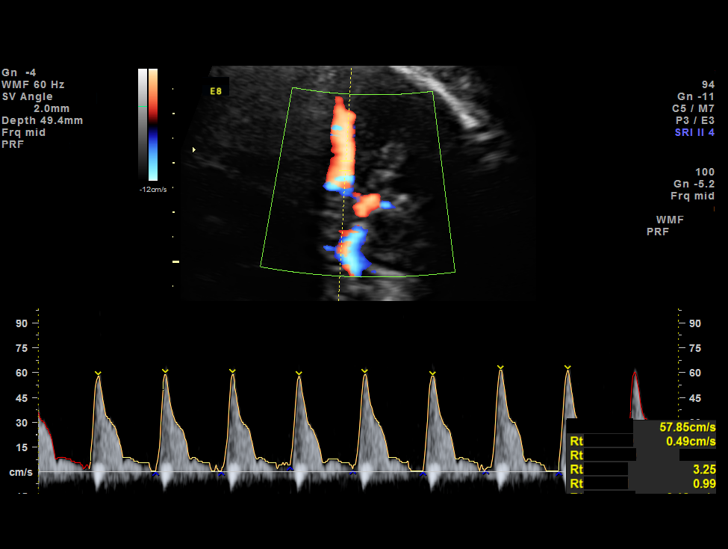
[im 7/25]
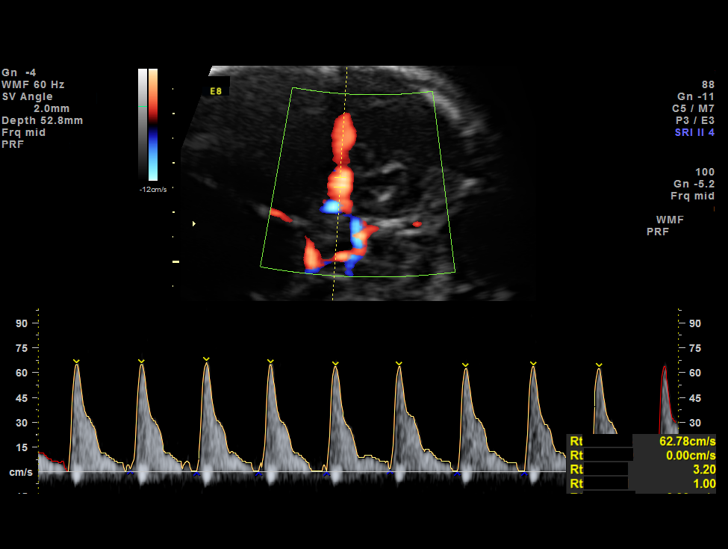
[im 9/25]
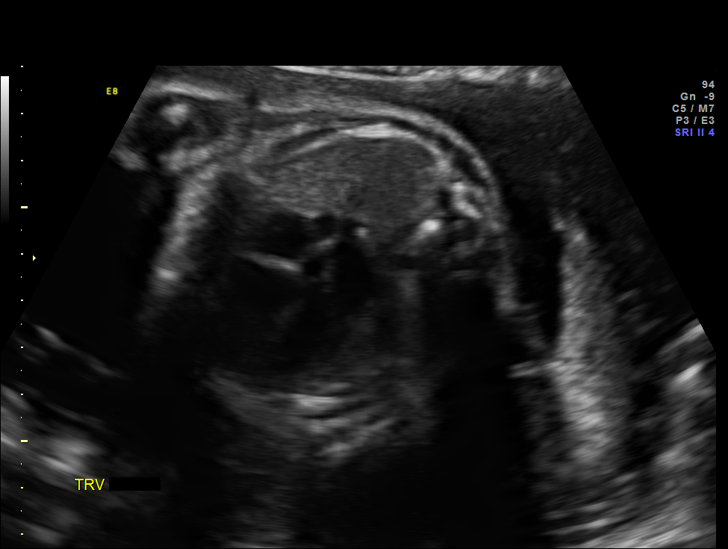
[im 11/25]
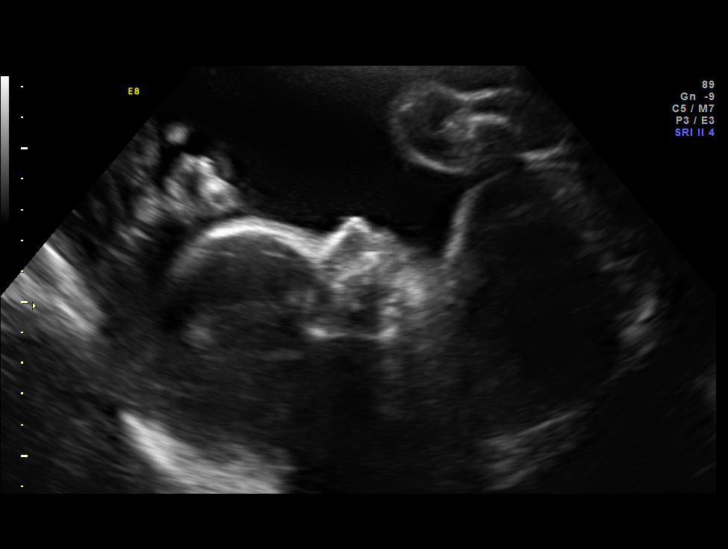
[im 13/25]
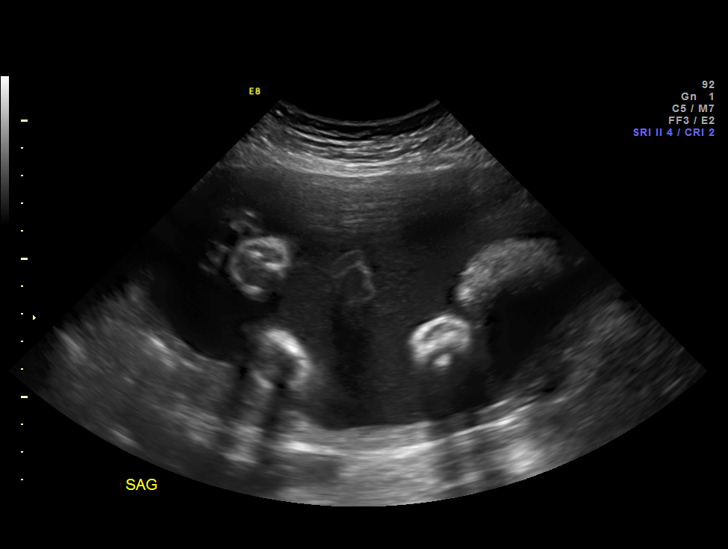
[im 15/25]
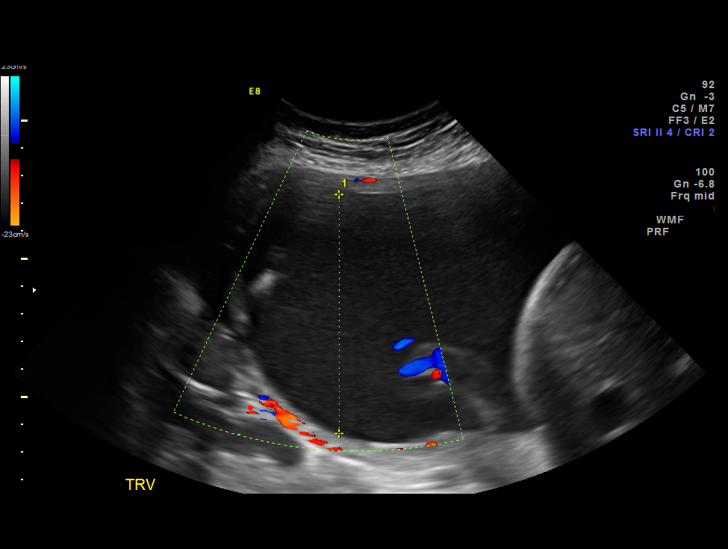
[im 17/25]
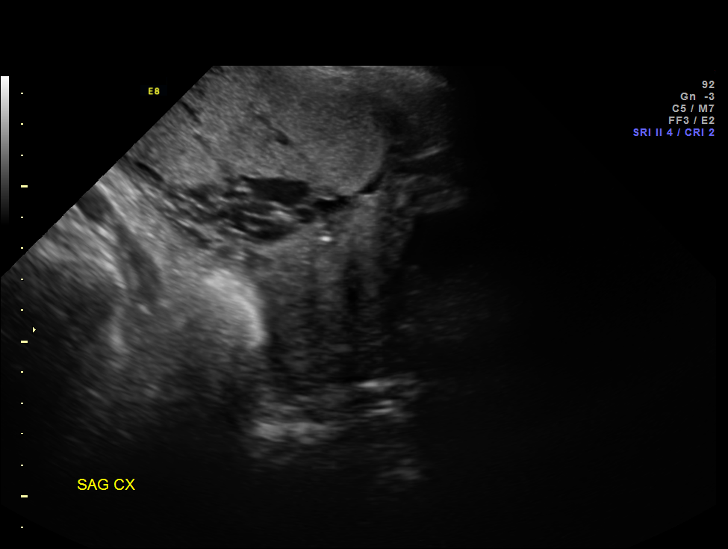
[im 19/25]
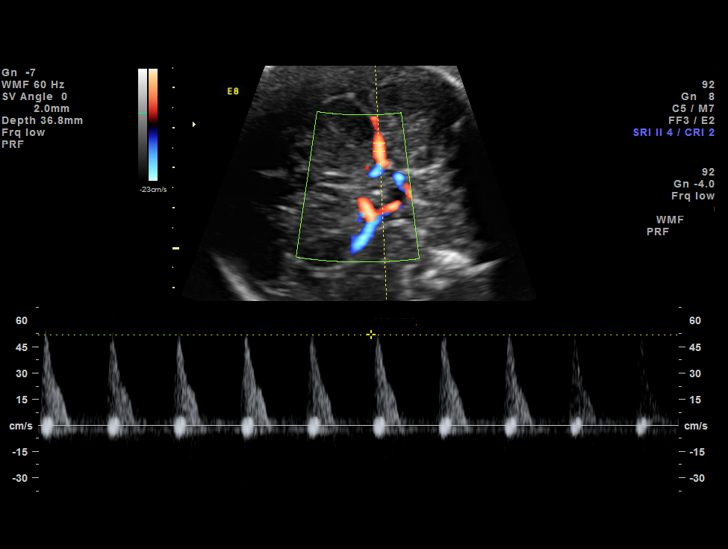
[im 21/25]
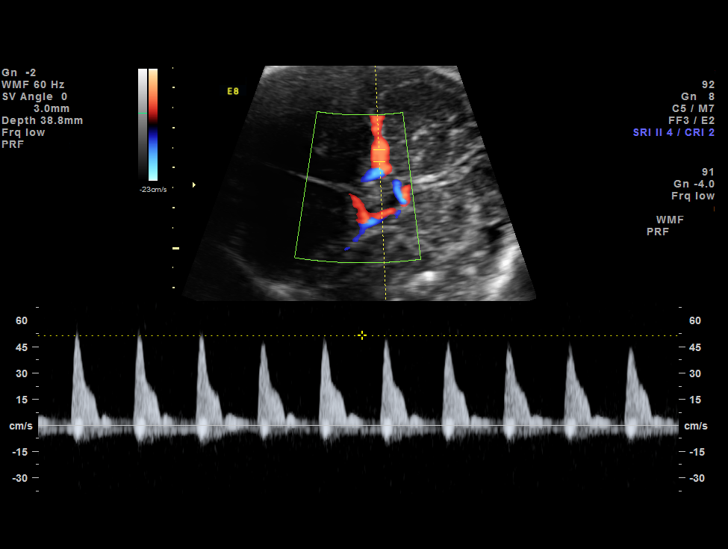
[im 23/25]
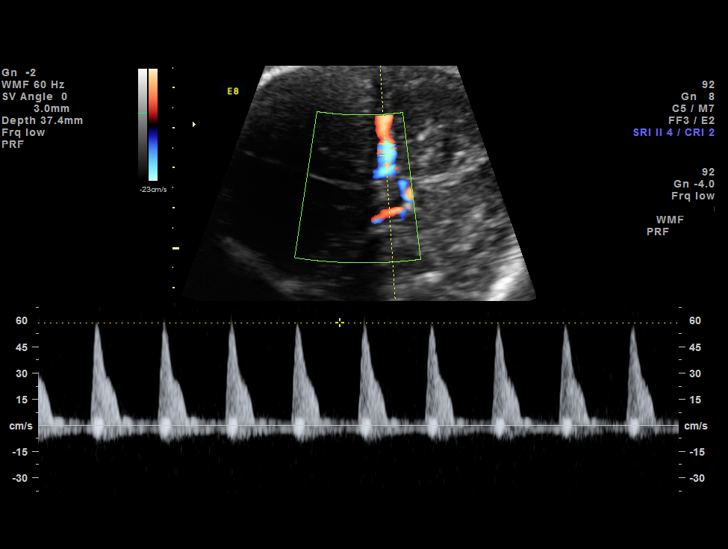
[im 25/25]
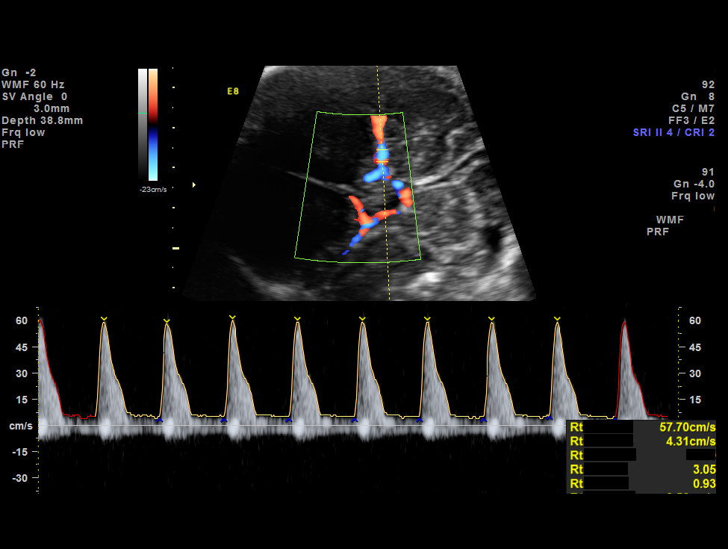

[13 of 25 positions shown; findings below may reference images not displayed]

OBSTETRICS REPORT
                      (Signed Final 10/09/2011 [DATE])

 Order#:         [id number]
Procedures

 US MCA DOPPLER                                        76821.0
Indications

 Maternal parvovirus infection
 Placenta previa/Low lying: No bleeding
 PPROM
 RH Negative
 Assess fetal well being
Fetal Evaluation

 Fetal Heart Rate:  163                          bpm
 Cardiac Activity:  Observed
 Presentation:      Cephalic
 Placenta:          Lt lateral, complete previa
 P. Cord            Previously Visualized
 Insertion:

 Amniotic Fluid
 AFI FV:      Subjectively upper-normal
                                             Larg Pckt:     8.6  cm
Gestational Age

 LMP:           28w 6d        Date:  03/21/11                 EDD:   12/26/11
 Best:          28w 6d     Det. By:  LMP  (03/21/11)          EDD:   12/26/11
Doppler - Fetal Vessels

 Middle Cerebral Artery
 S/D:   716.1                          RI:
        8
 PI:    3.15       > 97.5  %tile       PSV:       62.7    cm/s      1.65  MoM

Impression

 IUP at 28+6 weeks
 No evidence of hydrops
 High normal amniotic fluid volume vs mild polyhydramnios
 MCA dopplers: most of the velocities were between 1.5 and
 1.6 MoMs which may indicate at least mild to moderate fetal
 anemia
 Complete placenta previa
 (BPP [DATE])
Recommendations

 Will check MCA dopplers again on [REDACTED]. If the velocities
 remain elevated with MoMs above 1.5, will consider moving
 towards fetal blood sampling with possible transfusion if
 appropriate

 questions or concerns.

## 2013-05-26 ENCOUNTER — Ambulatory Visit (INDEPENDENT_AMBULATORY_CARE_PROVIDER_SITE_OTHER): Payer: BC Managed Care – PPO

## 2013-05-26 DIAGNOSIS — Z23 Encounter for immunization: Secondary | ICD-10-CM

## 2014-01-30 ENCOUNTER — Encounter: Payer: Self-pay | Admitting: Family

## 2014-01-30 ENCOUNTER — Ambulatory Visit (INDEPENDENT_AMBULATORY_CARE_PROVIDER_SITE_OTHER): Payer: BC Managed Care – PPO | Admitting: Family

## 2014-01-30 VITALS — BP 94/58 | Temp 98.7°F | Wt 134.0 lb

## 2014-01-30 DIAGNOSIS — R0982 Postnasal drip: Secondary | ICD-10-CM

## 2014-01-30 DIAGNOSIS — J301 Allergic rhinitis due to pollen: Secondary | ICD-10-CM

## 2014-01-30 DIAGNOSIS — J309 Allergic rhinitis, unspecified: Secondary | ICD-10-CM

## 2014-01-30 MED ORDER — METHYLPREDNISOLONE 4 MG PO KIT: PACK | ORAL | Status: AC

## 2014-01-30 NOTE — Progress Notes (Signed)
   Subjective:    Patient ID: [name], female    DOB: 07/14/1975, 38 y.o.   MRN: [id number]  HPI  38 year old white female, nonsmoker presents today with complaints of hoarseness, cough, sinus drainage, sore throat x1 month off and on. Patient [place] being in [place] in [place] one week. Denies any other environmental changes. Cough is productive with clear to yellow phlegm. Denies fever or chills. Reports 89-year-old being treated for scarlet fever currently on amoxicillin.  Review of Systems  Constitutional: Negative.   HENT: Positive for postnasal drip and sore throat.   Respiratory: Negative.   Cardiovascular: Negative.   Endocrine: Negative.   Musculoskeletal: Negative.   Skin: Negative.   Allergic/Immunologic: Positive for environmental allergies. Negative for food allergies and immunocompromised state.  Psychiatric/Behavioral: Negative.    Past Medical History  Diagnosis Date  . NEPHROTIC SYNDROME 04/19/2009  . DIZZINESS 03/07/2010    History   Social History  . Marital Status: Married    Spouse Name: N/A    Number of Children: 4  . Years of Education: N/A   Occupational History  . Stay at home mom    Social History Main Topics  . Smoking status: Never Smoker   . Smokeless tobacco: Not on file  . Alcohol Use: No  . Drug Use: No  . Sexual Activity:    Other Topics Concern  . Not on file   Social History Narrative  . No narrative on file    Past Surgical History  Procedure Laterality Date  . None    . Wisdom tooth extraction      Family History  Problem Relation Age of Onset  . Diabetes Father     Type II  . Hypertension Neg Hx   . Coronary artery disease Neg Hx   . Stroke Neg Hx   . Colon cancer Neg Hx   . Anesthesia problems Neg Hx     No Known Allergies  Current Outpatient Prescriptions on File Prior to Visit  Medication Sig Dispense Refill  . triamcinolone cream (KENALOG) 0.1 % Apply topically 2 (two) times  daily.  30 g  2   No current facility-administered medications on file prior to visit.    BP 94/58  Temp(Src) 98.7 F (37.1 C) (Oral)  Wt 134 lb (60.782 kg)chart    Objective:   Physical Exam  Constitutional: She is oriented to person, place, and time. She appears well-developed and well-nourished.  HENT:  Right Ear: External ear normal.  Left Ear: External ear normal.  Nose: Nose normal.  Mouth/Throat: Oropharynx is clear and moist.  Neck: Normal range of motion. Neck supple.  Cardiovascular: Normal rate, regular rhythm and normal heart sounds.   Pulmonary/Chest: Effort normal and breath sounds normal.  Musculoskeletal: Normal range of motion.  Neurological: She is alert and oriented to person, place, and time.  Skin: Skin is warm and dry.  Psychiatric: She has a normal mood and affect.     Rapid strep: Negative     Assessment & Plan:  Dorita FrayScarlett was seen today for sore throat.  Diagnoses and associated orders for this visit:  Hay fever  Post-nasal drip  Other Orders - methylPREDNISolone (MEDROL DOSEPAK) 4 MG tablet; follow package directions   Call the office with any questions or concerns. Recheck as scheduled and as needed.

## 2014-01-30 NOTE — Patient Instructions (Signed)
Hay Fever Hay fever is an allergic reaction to particles in the air. It cannot be passed from person to person. It cannot be cured, but it can be controlled. CAUSES  Hay fever is caused by something that triggers an allergic reaction (allergens). The following are examples of allergens:  Ragweed.  Feathers.  Animal dander.  Grass and tree pollens.  Cigarette smoke.  House dust.  Pollution. SYMPTOMS   Sneezing.  Runny or stuffy nose.  Tearing eyes.  Itchy eyes, nose, mouth, throat, skin, or other area.  Sore throat.  Headache.  Decreased sense of smell or taste. DIAGNOSIS Your caregiver will perform a physical exam and ask questions about the symptoms you are having.Allergy testing may be done to determine exactly what triggers your hay fever.  TREATMENT   Over-the-counter medicines may help symptoms. These include:  Antihistamines.  Decongestants. These may help with nasal congestion.  Your caregiver may prescribe medicines if over-the-counter medicines do not work.  Some people benefit from allergy shots when other medicines are not helpful. HOME CARE INSTRUCTIONS   Avoid the allergen that is causing your symptoms, if possible.  Take all medicine as told by your caregiver. SEEK MEDICAL CARE IF:   You have severe allergy symptoms and your current medicines are not helping.  Your treatment was working at one time, but you are now experiencing symptoms.  You have sinus congestion and pressure.  You develop a fever or headache.  You have thick nasal discharge.  You have asthma and have a worsening cough and wheezing. SEEK IMMEDIATE MEDICAL CARE IF:   You have swelling of your tongue or lips.  You have trouble breathing.  You feel lightheaded or like you are going to faint.  You have cold sweats.  You have a fever. Document Released: 06/02/2005 Document Revised: 08/25/2011 Document Reviewed: 08/28/2010 Exit[employer] Patient Information 2015  [employer]. This information is not intended to replace advice given to you by your health care provider. Make sure you discuss any questions you have with your health care provider.  

## 2014-01-30 NOTE — Progress Notes (Signed)
Pre visit review using our clinic review tool, if applicable. No additional management support is needed unless otherwise documented below in the visit note. 

## 2014-03-02 ENCOUNTER — Other Ambulatory Visit: Payer: Self-pay | Admitting: Obstetrics and Gynecology

## 2014-03-03 LAB — CYTOLOGY - PAP

## 2014-04-17 ENCOUNTER — Encounter: Payer: Self-pay | Admitting: Family

## 2015-09-20 DIAGNOSIS — Z6825 Body mass index (BMI) 25.0-25.9, adult: Secondary | ICD-10-CM | POA: Diagnosis not present

## 2015-09-20 DIAGNOSIS — Z01419 Encounter for gynecological examination (general) (routine) without abnormal findings: Secondary | ICD-10-CM | POA: Diagnosis not present

## 2015-12-20 ENCOUNTER — Ambulatory Visit (INDEPENDENT_AMBULATORY_CARE_PROVIDER_SITE_OTHER): Payer: BLUE CROSS/BLUE SHIELD | Admitting: Family Medicine

## 2015-12-20 ENCOUNTER — Encounter: Payer: Self-pay | Admitting: Family Medicine

## 2015-12-20 VITALS — BP 110/68 | HR 77 | Temp 98.6°F | Ht 62.0 in | Wt 141.6 lb

## 2015-12-20 DIAGNOSIS — H65192 Other acute nonsuppurative otitis media, left ear: Secondary | ICD-10-CM | POA: Diagnosis not present

## 2015-12-20 MED ORDER — AMOXICILLIN 500 MG PO CAPS: 500.0000 mg | ORAL_CAPSULE | Freq: Three times a day (TID) | ORAL | Status: DC

## 2015-12-20 NOTE — Progress Notes (Signed)
Pre visit review using our clinic review tool, if applicable. No additional management support is needed unless otherwise documented below in the visit note. 

## 2015-12-20 NOTE — Patient Instructions (Signed)

## 2015-12-20 NOTE — Progress Notes (Signed)
Subjective:    Patient ID: Burnett ShengScarlett C Cuyler, female    DOB: 02/27/1976, 40 y.o.   MRN: [id number]  HPI  Ms. Vallarie Marerny is a 40 year old female who presents today with left ear pain that started one day ago. Pain is rated as a 3 when aching but will have sharp pain that is noted as a 7.  Associated symptoms of sore throat and tender lymph nodes are present. She denies fever, chills, sweats, sinus pressure/pain, rhinitis, congestion, tooth pain, N/V/D, itchy/watery eyes or cough. No treatment at home at this time. No history of asthma/bronchitis. Recent sick contact exposure of a child with strep throat.  Review of Systems  Constitutional: Negative for fever and chills.  HENT: Positive for ear pain and sore throat. Negative for congestion, postnasal drip and rhinorrhea.   Eyes: Negative for visual disturbance.  Respiratory: Negative for cough and shortness of breath.   Cardiovascular: Negative for chest pain and palpitations.  Gastrointestinal: Negative for nausea, vomiting, abdominal pain, diarrhea and constipation.  Genitourinary: Negative for dysuria, urgency and frequency.  Musculoskeletal: Negative for myalgias.  Skin: Negative for rash.  Neurological: Negative for dizziness, seizures, light-headedness, numbness and headaches.   Past Medical History  Diagnosis Date  . NEPHROTIC SYNDROME 04/19/2009  . DIZZINESS 03/07/2010     Social History   Social History  . Marital Status: Married    Spouse Name: N/A  . Number of Children: 4  . Years of Education: N/A   Occupational History  . Stay at home mom    Social History Main Topics  . Smoking status: Never Smoker   . Smokeless tobacco: Not on file  . Alcohol Use: No  . Drug Use: No  . Sexual Activity: Not on file   Other Topics Concern  . Not on file   Social History Narrative    Past Surgical History  Procedure Laterality Date  . None    . Wisdom tooth extraction      Family History  Problem Relation Age of Onset  .  Diabetes Father     Type II  . Hypertension Neg Hx   . Coronary artery disease Neg Hx   . Stroke Neg Hx   . Colon cancer Neg Hx   . Anesthesia problems Neg Hx     No Known Allergies  Current Outpatient Prescriptions on File Prior to Visit  Medication Sig Dispense Refill  . Multiple Vitamin (MULTIVITAMIN) tablet Take 1 tablet by mouth daily.     No current facility-administered medications on file prior to visit.    BP 110/68 mmHg  Pulse 77  Temp(Src) 98.6 F (37 C) (Oral)  Ht 5\' 2"  (1.575 m)  Wt 141 lb 9.6 oz (64.229 kg)  BMI 25.89 kg/m2  LMP 12/13/2015       Objective:   Physical Exam  Constitutional: She is oriented to person, place, and time. She appears well-developed and well-nourished.  HENT:  Right Ear: Tympanic membrane normal.  Left Ear: Tympanic membrane is injected and erythematous.  Nose: No rhinorrhea. Right sinus exhibits no maxillary sinus tenderness and no frontal sinus tenderness. Left sinus exhibits no maxillary sinus tenderness and no frontal sinus tenderness.  Mouth/Throat: Mucous membranes are normal. No oropharyngeal exudate or posterior oropharyngeal erythema.  Sclerotic right TM  Eyes: Pupils are equal, round, and reactive to light. No scleral icterus.  Neck: Neck supple.  Cardiovascular: Normal rate, regular rhythm and normal heart sounds.   Pulmonary/Chest: Effort normal and breath sounds  normal. She has no wheezes. She has no rales.  Lymphadenopathy:    She has cervical adenopathy.  Neurological: She is alert and oriented to person, place, and time.  Skin: Skin is warm and dry. No rash noted.  Psychiatric: She has a normal mood and affect. Her behavior is normal. Judgment and thought content normal.       Assessment & Plan:  1. Acute nonsuppurative otitis media of left ear Exam and history support treatment for AOM.  Advised patient on supportive measures:  Get rest, drink plenty of fluids, and use tylenol or ibuprofen as needed for  pain. Follow up if fever >101, if symptoms worsen or if symptoms are not improved in 3 to 4 days, or if antibiotic is not tolerated. Patient verbalizes understanding.    - amoxicillin (AMOXIL) 500 MG capsule; Take 1 capsule (500 mg total) by mouth 3 (three) times daily.  Dispense: 30 capsule; Refill: 0  Roddie McJulia Czar Ysaguirre, FNP-C

## 2016-02-14 ENCOUNTER — Telehealth: Payer: Self-pay | Admitting: Family Medicine

## 2016-02-14 DIAGNOSIS — R002 Palpitations: Secondary | ICD-10-CM

## 2016-02-14 DIAGNOSIS — R000 Tachycardia, unspecified: Secondary | ICD-10-CM

## 2016-02-14 NOTE — Telephone Encounter (Signed)
° °  Pt has been having racing heart rate really bad. She called [facility] to make a appt and told her she will need a referral. She called today to ask for a referral

## 2016-02-14 NOTE — Telephone Encounter (Signed)
Pt would like to see [name] at [facility]

## 2016-02-15 ENCOUNTER — Telehealth: Payer: Self-pay | Admitting: Nurse Practitioner

## 2016-02-15 NOTE — Telephone Encounter (Signed)
I called the pt and informed her Dr Caryl NeverBurchette reviewed the message below and stated to advise the pt if she has any further episodes as below she should go to the ER and she agreed.  She is aware the referral will be placed and Misty stated she will enter this.

## 2016-02-15 NOTE — Telephone Encounter (Signed)
Received call from Dr. Caryl NeverBurchette, patient's PCP about patient.  He states he has not seen the patient in the office but received a message from her that she wanted to be seen by cardiology for tachycardia and feeling like she would pass out.  He would like patient to be seen early next week and advised in his note to her that she may have to go to the ER for evaluation of her symptoms.  I advised that I would call her to determine symptoms and schedule appointment based on that.  He thanked me for the call and offered that Dr. Elease HashimotoNahser can call back to talk with him if needed.

## 2016-02-15 NOTE — Telephone Encounter (Signed)
Ok to set up 

## 2016-02-15 NOTE — Telephone Encounter (Signed)
Pt following up on referral request. Pt states this is an urgent request, pt would like to be seen asap.  Pt had a bad episode on wed and she has concerns.  Pt had dizziness,almost passed out, saw white, heart racing and it lasted 30 min.  Pt had one a week before that, but it lasted only 5 min.

## 2016-02-15 NOTE — Telephone Encounter (Addendum)
Left a message for a return call.  Referral placed in the system.

## 2016-02-15 NOTE — Telephone Encounter (Signed)
Spoke with patient who repeats feeling like heart racing more frequently recently.  She states this has occurred for many years but had very bad episode this past Wed. 02/13/16.  She states she does not know what her pulse rate was during the episode.  States Wed. 8/30 episode may have lasted as long as 30-35 minutes. She states she sees white spots and feels like she will pass out.  States during these episodes her chest is pounding/shaking and she feels fatigued afterwards.  She states her chest has been tender since the episode Wed.  I reviewed with Dr. Elease HashimotoNahser who is in the office and he advised he can see her on Wed. 9/6.  I instructed her about the valsalva maneuver and advised her to avoid caffeine.  She states she does not drink any caffeine nor take any herbal supplements.  She thanked me for the appointment and the phone call.

## 2016-02-19 ENCOUNTER — Encounter: Payer: Self-pay | Admitting: Cardiovascular Disease

## 2016-02-20 ENCOUNTER — Encounter: Payer: Self-pay | Admitting: Cardiovascular Disease

## 2016-02-20 ENCOUNTER — Encounter (INDEPENDENT_AMBULATORY_CARE_PROVIDER_SITE_OTHER): Payer: Self-pay

## 2016-02-20 ENCOUNTER — Ambulatory Visit (INDEPENDENT_AMBULATORY_CARE_PROVIDER_SITE_OTHER): Payer: BLUE CROSS/BLUE SHIELD | Admitting: Cardiovascular Disease

## 2016-02-20 VITALS — BP 90/60 | HR 74 | Ht 62.0 in | Wt 143.2 lb

## 2016-02-20 DIAGNOSIS — I493 Ventricular premature depolarization: Secondary | ICD-10-CM | POA: Diagnosis not present

## 2016-02-20 DIAGNOSIS — I471 Supraventricular tachycardia: Secondary | ICD-10-CM | POA: Diagnosis not present

## 2016-02-20 LAB — BASIC METABOLIC PANEL
BUN: 11 mg/dL (ref 7–25)
CALCIUM: 9.5 mg/dL (ref 8.6–10.2)
CO2: 27 mmol/L (ref 20–31)
CREATININE: 0.73 mg/dL (ref 0.50–1.10)
Chloride: 102 mmol/L (ref 98–110)
GLUCOSE: 70 mg/dL (ref 65–99)
Potassium: 4.1 mmol/L (ref 3.5–5.3)
SODIUM: 139 mmol/L (ref 135–146)

## 2016-02-20 LAB — TSH: TSH: 1.81 mIU/L

## 2016-02-20 MED ORDER — PROPRANOLOL HCL 10 MG PO TABS: 10.0000 mg | ORAL_TABLET | Freq: Four times a day (QID) | ORAL | 11 refills | Status: DC | PRN

## 2016-02-20 NOTE — Progress Notes (Signed)
Cardiology Office Note   Date:  02/20/2016   ID:  VERALYN LOPP, DOB [birth year], MRN [id number]  PCP:  Kristian Covey, MD  Cardiologist:   Kristeen Miss, MD   Chief Complaint  Patient presents with  . Palpitations      History of Present Illness: [name] is a 40 y.o. female who presents for evaluation of her palpitations  Has had palpitations off and on for years.  Has seen Hochrein years ago Has some occasional near syncope This is associated with "heart racing" These can last as long as 30 minutes.  Another was 5 minutes.  HR was too fast to count .  Has had brief episodes of palpitations that last only for a second or so .   Sees Dr. Caryl Never.  Has worn a heart monitor .   Did not have any episodes while she was wearing the monitor   Does not drink caffiene. Not associated with hormonal changes.  She does not sleep well at night . Has 5 children ( ages 64,11,8,6,4)   Father has some heart problems .  Father Has CAD also    Past Medical History:  Diagnosis Date  . DIZZINESS 03/07/2010  . NEPHROTIC SYNDROME 04/19/2009    Past Surgical History:  Procedure Laterality Date  . None    . WISDOM TOOTH EXTRACTION       Current Outpatient Prescriptions  Medication Sig Dispense Refill  . Multiple Vitamin (MULTIVITAMIN) tablet Take 1 tablet by mouth daily.     No current facility-administered medications for this visit.     Allergies:   Review of patient's allergies indicates no known allergies.    Social History:  The patient  reports that she has never smoked. She has never used smokeless tobacco. She reports that she does not drink alcohol or use drugs.   Family History:  The patient's family history includes Diabetes in her father.    ROS:  Please see the history of present illness.    Review of Systems: Constitutional:  denies fever, chills, diaphoresis, appetite change and fatigue.  HEENT: denies photophobia, eye pain, redness, hearing  loss, ear pain, congestion, sore throat, rhinorrhea, sneezing, neck pain, neck stiffness and tinnitus.  Respiratory: denies SOB, DOE, cough, chest tightness, and wheezing.  Cardiovascular: denies chest pain, palpitations and leg swelling.  Gastrointestinal: denies nausea, vomiting, abdominal pain, diarrhea, constipation, blood in stool.  Genitourinary: denies dysuria, urgency, frequency, hematuria, flank pain and difficulty urinating.  Musculoskeletal: denies  myalgias, back pain, joint swelling, arthralgias and gait problem.   Skin: denies pallor, rash and wound.  Neurological: denies dizziness, seizures, syncope, weakness, light-headedness, numbness and headaches.   Hematological: denies adenopathy, easy bruising, personal or family bleeding history.  Psychiatric/ Behavioral: denies suicidal ideation, mood changes, confusion, nervousness, sleep disturbance and agitation.       All other systems are reviewed and negative.    PHYSICAL EXAM: VS:  BP 90/60 (BP Location: Left Arm, Patient Position: Sitting, Cuff Size: Normal)   Pulse 74   Ht 5\' 2"  (1.575 m)   Wt 143 lb 3.2 oz (65 kg)   BMI 26.19 kg/m  , BMI Body mass index is 26.19 kg/m. GEN: Well nourished, well developed, in no acute distress  HEENT: normal  Neck: no JVD, carotid bruits, or masses Cardiac: RRR; no murmurs, rubs, or gallops,no edema  Respiratory:  clear to auscultation bilaterally, normal work of breathing GI: soft, nontender, nondistended, + BS MS: no deformity or atrophy  Skin: warm and dry, no rash Neuro:  Strength and sensation are intact Psych: normal   EKG:  EKG is ordered today. The ekg ordered today demonstrates  NSR at 74 with PACS. Otherwise normal eCG, normal PR,   No delta wave.     Recent Labs: No results found for requested labs within last 8760 hours.    Lipid Panel No results found for: CHOL, TRIG, HDL, CHOLHDL, VLDL, LDLCALC, LDLDIRECT    Wt Readings from Last 3 Encounters:  02/20/16  143 lb 3.2 oz (65 kg)  12/20/15 141 lb 9.6 oz (64.2 kg)  01/30/14 134 lb (60.8 kg)      Other studies Reviewed: Additional studies/ records that were reviewed today include: . Review of the above records demonstrates:    ASSESSMENT AND PLAN:  1.  SVT:  [name] presents with palpitations and symptoms consistent with supraventricular tachycardia. She's had several episodes of brief tachycardia that only last for a few seconds and these are likely premature ventricular contractions. She's had 2 episodes of palpitations that lasted 5 minutes and one lasted 30 mins. She felt very lightheaded and felt generalized chest pressure. I suspect that these were premature ventricular contractions. She called our office and was instructed in the Valsalva maneuver. I've reviewed the Valsalva maneuver with her. I've also in her information about stimulation of the diving reflex.  She had an echocardiogram in March 2013 which revealed normal left ventricle systolic function. The echo is normal.  She will get a home monitoring application from [employer] Lourena Simmonds([place]ardia)  We'll give her a prescription for propranolol which she can use as an as-needed basis. Her blood pressure is too low to start daily metoprolol or diltiazem.    We discussed we discussed long-term solutions such as RF ablation .   I'll see her again in 6 weeks for follow-up visit.  2. PVCs : she has palpitations that only last for a second or so. These are likely PVCs. Will check labs today  - BMP, TSH, CBC.    Current medicines are reviewed at length with the patient today.  The patient does not have concerns regarding medicines.  Labs/ tests ordered today include:  No orders of the defined types were placed in this encounter.   Kristeen MissPhilip Nahser, MD  02/20/2016 10:28 AM    [facility] [address], [place], [place]  [address] Phone: [phone]; Fax: [phone]

## 2016-02-20 NOTE — Patient Instructions (Addendum)
Medication Instructions:  START Propranolol 10 mg up to 4 times per day as needed for palpitations/fast heart rate   Labwork: TODAY - TSH, CBC, basic metabolic panel   Testing/Procedures: None Ordered   Follow-Up: Your physician recommends that you return for a follow-up appointment on: Wed. 10/18 @ 9:45 am with Dr. Elease HashimotoNahser   If you need a refill on your cardiac medications before your next appointment, please call your pharmacy.   Thank you for choosing [facility]! Eligha BridegroomMichelle Aylene Acoff, RN [phone]

## 2016-02-21 LAB — CBC WITH DIFFERENTIAL/PLATELET
BASOS ABS: 0 {cells}/uL (ref 0–200)
Basophils Relative: 0 %
EOS ABS: 159 {cells}/uL (ref 15–500)
EOS PCT: 3 %
HCT: 37 % (ref 35.0–45.0)
Hemoglobin: 12.6 g/dL (ref 11.7–15.5)
Lymphocytes Relative: 32 %
Lymphs Abs: 1696 cells/uL (ref 850–3900)
MCH: 29.9 pg (ref 27.0–33.0)
MCHC: 34.1 g/dL (ref 32.0–36.0)
MCV: 87.9 fL (ref 80.0–100.0)
MONOS PCT: 10 %
MPV: 9.6 fL (ref 7.5–12.5)
Monocytes Absolute: 530 cells/uL (ref 200–950)
NEUTROS ABS: 2915 {cells}/uL (ref 1500–7800)
NEUTROS PCT: 55 %
PLATELETS: 260 10*3/uL (ref 140–400)
RBC: 4.21 MIL/uL (ref 3.80–5.10)
RDW: 12.6 % (ref 11.0–15.0)
WBC: 5.3 10*3/uL (ref 3.8–10.8)

## 2016-02-27 DIAGNOSIS — M9903 Segmental and somatic dysfunction of lumbar region: Secondary | ICD-10-CM | POA: Diagnosis not present

## 2016-02-27 DIAGNOSIS — M62830 Muscle spasm of back: Secondary | ICD-10-CM | POA: Diagnosis not present

## 2016-02-27 DIAGNOSIS — M545 Low back pain: Secondary | ICD-10-CM | POA: Diagnosis not present

## 2016-02-27 DIAGNOSIS — M9904 Segmental and somatic dysfunction of sacral region: Secondary | ICD-10-CM | POA: Diagnosis not present

## 2016-02-28 DIAGNOSIS — M62830 Muscle spasm of back: Secondary | ICD-10-CM | POA: Diagnosis not present

## 2016-02-28 DIAGNOSIS — M545 Low back pain: Secondary | ICD-10-CM | POA: Diagnosis not present

## 2016-02-28 DIAGNOSIS — M9904 Segmental and somatic dysfunction of sacral region: Secondary | ICD-10-CM | POA: Diagnosis not present

## 2016-02-28 DIAGNOSIS — M9903 Segmental and somatic dysfunction of lumbar region: Secondary | ICD-10-CM | POA: Diagnosis not present

## 2016-03-01 DIAGNOSIS — M545 Low back pain: Secondary | ICD-10-CM | POA: Diagnosis not present

## 2016-03-01 DIAGNOSIS — M9904 Segmental and somatic dysfunction of sacral region: Secondary | ICD-10-CM | POA: Diagnosis not present

## 2016-03-01 DIAGNOSIS — M62830 Muscle spasm of back: Secondary | ICD-10-CM | POA: Diagnosis not present

## 2016-03-01 DIAGNOSIS — M9903 Segmental and somatic dysfunction of lumbar region: Secondary | ICD-10-CM | POA: Diagnosis not present

## 2016-03-04 DIAGNOSIS — M9903 Segmental and somatic dysfunction of lumbar region: Secondary | ICD-10-CM | POA: Diagnosis not present

## 2016-03-04 DIAGNOSIS — M9904 Segmental and somatic dysfunction of sacral region: Secondary | ICD-10-CM | POA: Diagnosis not present

## 2016-03-04 DIAGNOSIS — M545 Low back pain: Secondary | ICD-10-CM | POA: Diagnosis not present

## 2016-03-04 DIAGNOSIS — M62830 Muscle spasm of back: Secondary | ICD-10-CM | POA: Diagnosis not present

## 2016-03-05 DIAGNOSIS — M9904 Segmental and somatic dysfunction of sacral region: Secondary | ICD-10-CM | POA: Diagnosis not present

## 2016-03-05 DIAGNOSIS — M545 Low back pain: Secondary | ICD-10-CM | POA: Diagnosis not present

## 2016-03-05 DIAGNOSIS — M9903 Segmental and somatic dysfunction of lumbar region: Secondary | ICD-10-CM | POA: Diagnosis not present

## 2016-03-05 DIAGNOSIS — M62830 Muscle spasm of back: Secondary | ICD-10-CM | POA: Diagnosis not present

## 2016-03-08 DIAGNOSIS — M9903 Segmental and somatic dysfunction of lumbar region: Secondary | ICD-10-CM | POA: Diagnosis not present

## 2016-03-08 DIAGNOSIS — M545 Low back pain: Secondary | ICD-10-CM | POA: Diagnosis not present

## 2016-03-08 DIAGNOSIS — M62830 Muscle spasm of back: Secondary | ICD-10-CM | POA: Diagnosis not present

## 2016-03-08 DIAGNOSIS — M9904 Segmental and somatic dysfunction of sacral region: Secondary | ICD-10-CM | POA: Diagnosis not present

## 2016-03-11 DIAGNOSIS — M9903 Segmental and somatic dysfunction of lumbar region: Secondary | ICD-10-CM | POA: Diagnosis not present

## 2016-03-11 DIAGNOSIS — M9904 Segmental and somatic dysfunction of sacral region: Secondary | ICD-10-CM | POA: Diagnosis not present

## 2016-03-11 DIAGNOSIS — M545 Low back pain: Secondary | ICD-10-CM | POA: Diagnosis not present

## 2016-03-11 DIAGNOSIS — M62830 Muscle spasm of back: Secondary | ICD-10-CM | POA: Diagnosis not present

## 2016-03-12 DIAGNOSIS — M62830 Muscle spasm of back: Secondary | ICD-10-CM | POA: Diagnosis not present

## 2016-03-12 DIAGNOSIS — M545 Low back pain: Secondary | ICD-10-CM | POA: Diagnosis not present

## 2016-03-12 DIAGNOSIS — M9903 Segmental and somatic dysfunction of lumbar region: Secondary | ICD-10-CM | POA: Diagnosis not present

## 2016-03-12 DIAGNOSIS — M9904 Segmental and somatic dysfunction of sacral region: Secondary | ICD-10-CM | POA: Diagnosis not present

## 2016-03-13 DIAGNOSIS — M9904 Segmental and somatic dysfunction of sacral region: Secondary | ICD-10-CM | POA: Diagnosis not present

## 2016-03-13 DIAGNOSIS — M62830 Muscle spasm of back: Secondary | ICD-10-CM | POA: Diagnosis not present

## 2016-03-13 DIAGNOSIS — M9903 Segmental and somatic dysfunction of lumbar region: Secondary | ICD-10-CM | POA: Diagnosis not present

## 2016-03-13 DIAGNOSIS — M545 Low back pain: Secondary | ICD-10-CM | POA: Diagnosis not present

## 2016-03-18 DIAGNOSIS — M9903 Segmental and somatic dysfunction of lumbar region: Secondary | ICD-10-CM | POA: Diagnosis not present

## 2016-03-18 DIAGNOSIS — M9904 Segmental and somatic dysfunction of sacral region: Secondary | ICD-10-CM | POA: Diagnosis not present

## 2016-03-18 DIAGNOSIS — M545 Low back pain: Secondary | ICD-10-CM | POA: Diagnosis not present

## 2016-03-18 DIAGNOSIS — M62830 Muscle spasm of back: Secondary | ICD-10-CM | POA: Diagnosis not present

## 2016-03-19 DIAGNOSIS — M9904 Segmental and somatic dysfunction of sacral region: Secondary | ICD-10-CM | POA: Diagnosis not present

## 2016-03-19 DIAGNOSIS — M545 Low back pain: Secondary | ICD-10-CM | POA: Diagnosis not present

## 2016-03-19 DIAGNOSIS — M9903 Segmental and somatic dysfunction of lumbar region: Secondary | ICD-10-CM | POA: Diagnosis not present

## 2016-03-19 DIAGNOSIS — M62830 Muscle spasm of back: Secondary | ICD-10-CM | POA: Diagnosis not present

## 2016-03-20 DIAGNOSIS — M62830 Muscle spasm of back: Secondary | ICD-10-CM | POA: Diagnosis not present

## 2016-03-20 DIAGNOSIS — M9904 Segmental and somatic dysfunction of sacral region: Secondary | ICD-10-CM | POA: Diagnosis not present

## 2016-03-20 DIAGNOSIS — M9903 Segmental and somatic dysfunction of lumbar region: Secondary | ICD-10-CM | POA: Diagnosis not present

## 2016-03-20 DIAGNOSIS — M545 Low back pain: Secondary | ICD-10-CM | POA: Diagnosis not present

## 2016-03-24 ENCOUNTER — Encounter: Payer: Self-pay | Admitting: Cardiovascular Disease

## 2016-03-24 ENCOUNTER — Telehealth: Payer: Self-pay | Admitting: Cardiovascular Disease

## 2016-03-24 DIAGNOSIS — M545 Low back pain: Secondary | ICD-10-CM | POA: Diagnosis not present

## 2016-03-24 DIAGNOSIS — M9903 Segmental and somatic dysfunction of lumbar region: Secondary | ICD-10-CM | POA: Diagnosis not present

## 2016-03-24 DIAGNOSIS — M9904 Segmental and somatic dysfunction of sacral region: Secondary | ICD-10-CM | POA: Diagnosis not present

## 2016-03-24 DIAGNOSIS — M62830 Muscle spasm of back: Secondary | ICD-10-CM | POA: Diagnosis not present

## 2016-03-24 NOTE — Telephone Encounter (Signed)
Received [employer] tracing from patient that reveals SVT at rate >200 bpm.  Dr. Elease HashimotoNahser called and spoke with patient's husband and I was later able to contact patient. We discussed need for evaluation by EP and have scheduled patient with Dr. Elberta Fortisamnitz tomorrow, 10/10 at 2:45.  I advised her to continue current treatment plan of valsalva maneuver and/or propranolol for any future episodes of SVT.  I advised her to monitor if she is able.  She verbalized understanding and agreement with plan and thanked me for the call.

## 2016-03-24 NOTE — Telephone Encounter (Signed)
New message   Patient calling wants to talk with nurse regarding an App she brought / recording heart rate .   No chest pain    No sob / Just weak

## 2016-03-24 NOTE — Telephone Encounter (Signed)
Spoke with patient who states she had an episode of fast heart rate similar to episodes described at office visit on 9/6.  She has [employer] monitor and was able to capture some ekg readings.  She states she had to kneel down because she got very light-headed during the event but did not pass out.  She has not taken propranolol because by the time she could have gotten to the medicine bottle, the episode had resolved.  She states she feels fine now and is out grocery shopping.  I advised her to send monitor reading to us when she returns home.  She verbalized understanding and agreement and thanked me for the call.

## 2016-03-25 ENCOUNTER — Encounter: Payer: Self-pay | Admitting: *Deleted

## 2016-03-25 ENCOUNTER — Ambulatory Visit (INDEPENDENT_AMBULATORY_CARE_PROVIDER_SITE_OTHER): Payer: BLUE CROSS/BLUE SHIELD | Admitting: Cardiology

## 2016-03-25 ENCOUNTER — Encounter: Payer: Self-pay | Admitting: Cardiology

## 2016-03-25 ENCOUNTER — Other Ambulatory Visit: Payer: Self-pay | Admitting: Cardiology

## 2016-03-25 VITALS — BP 106/72 | HR 62 | Ht 62.0 in | Wt 147.6 lb

## 2016-03-25 DIAGNOSIS — I471 Supraventricular tachycardia: Secondary | ICD-10-CM | POA: Diagnosis not present

## 2016-03-25 DIAGNOSIS — Z01812 Encounter for preprocedural laboratory examination: Secondary | ICD-10-CM | POA: Diagnosis not present

## 2016-03-25 LAB — BASIC METABOLIC PANEL
BUN: 14 mg/dL (ref 7–25)
CALCIUM: 9.5 mg/dL (ref 8.6–10.2)
CO2: 29 mmol/L (ref 20–31)
CREATININE: 0.67 mg/dL (ref 0.50–1.10)
Chloride: 102 mmol/L (ref 98–110)
Glucose, Bld: 79 mg/dL (ref 65–99)
Potassium: 4 mmol/L (ref 3.5–5.3)
Sodium: 140 mmol/L (ref 135–146)

## 2016-03-25 LAB — CBC WITH DIFFERENTIAL/PLATELET
BASOS ABS: 0 {cells}/uL (ref 0–200)
Basophils Relative: 0 %
EOS PCT: 6 %
Eosinophils Absolute: 276 cells/uL (ref 15–500)
HCT: 36 % (ref 35.0–45.0)
Hemoglobin: 12.3 g/dL (ref 11.7–15.5)
Lymphocytes Relative: 30 %
Lymphs Abs: 1380 cells/uL (ref 850–3900)
MCH: 30.7 pg (ref 27.0–33.0)
MCHC: 34.2 g/dL (ref 32.0–36.0)
MCV: 89.8 fL (ref 80.0–100.0)
MONOS PCT: 9 %
MPV: 9.7 fL (ref 7.5–12.5)
Monocytes Absolute: 414 cells/uL (ref 200–950)
NEUTROS ABS: 2530 {cells}/uL (ref 1500–7800)
NEUTROS PCT: 55 %
PLATELETS: 275 10*3/uL (ref 140–400)
RBC: 4.01 MIL/uL (ref 3.80–5.10)
RDW: 13.3 % (ref 11.0–15.0)
WBC: 4.6 10*3/uL (ref 3.8–10.8)

## 2016-03-25 NOTE — Patient Instructions (Addendum)
Medication Instructions:    Your physician recommends that you continue on your current medications as directed. Please refer to the Current Medication list given to you today.  --- If you need a refill on your cardiac medications before your next appointment, please call your pharmacy. ---  Labwork:  Pre procedure labs today: BMET & CBC w/ diff  Testing/Procedures: Your physician has recommended that you have an ablation. Catheter ablation is a medical procedure used to treat some cardiac arrhythmias (irregular heartbeats). During catheter ablation, a long, thin, flexible tube is put into a blood vessel in your groin (upper thigh), or neck. This tube is called an ablation catheter. It is then guided to your heart through the blood vessel. Radio frequency waves destroy small areas of heart tissue where abnormal heartbeats may cause an arrhythmia to start. Please see the instruction sheet given to you today.  Follow-Up:  Your physician recommends that you schedule a follow-up appointment in: 4 weeks, after your procedure on 04/07/16, with Dr. Elberta Fortis.  Thank you for choosing [facility]!!   Dory Horn, RN [phone]   Any Other Special Instructions Will Be Listed Below (If Applicable). Cardiac Ablation Cardiac ablation is a procedure to disable a small amount of heart tissue in very specific places. The heart has many electrical connections. Sometimes these connections are abnormal and can cause the heart to beat very fast or irregularly. By disabling some of the problem areas, heart rhythm can be improved or made normal. Ablation is done for people who:   Have Wolff-Parkinson-White syndrome.   Have other fast heart rhythms (tachycardia).   Have taken medicines for an abnormal heart rhythm (arrhythmia) that resulted in:   No success.   Side effects.   May have a high-risk heartbeat that could result in death.  LET [facility] CARE PROVIDER KNOW ABOUT:   Any  allergies you have or any previous reactions you have had to X-ray dye, food (such as seafood), medicine, or tape.   All medicines you are taking, including vitamins, herbs, eye drops, creams, and over-the-counter medicines.   Previous problems you or members of your family have had with the use of anesthetics.   Any blood disorders you have.   Previous surgeries or procedures (such as a kidney transplant) you have had.   Medical conditions you have (such as kidney failure).  RISKS AND COMPLICATIONS Generally, cardiac ablation is a safe procedure. However, problems can occur and include:   Increased risk of cancer. Depending on how long it takes to do the ablation, the dose of radiation can be high.  Bruising and bleeding where a thin, flexible tube (catheter) was inserted during the procedure.   Bleeding into the chest, especially into the sac that surrounds the heart (serious).  Need for a permanent pacemaker if the normal electrical system is damaged.   The procedure may not be fully effective, and this may not be recognized for months. Repeat ablation procedures are sometimes required. BEFORE THE PROCEDURE   Follow any instructions from your health care provider regarding eating and drinking before the procedure.   Take your medicines as directed at regular times with water, unless instructed otherwise by your health care provider. If you are taking diabetes medicine, including insulin, ask how you are to take it and if there are any special instructions you should follow. It is common to adjust insulin dosing the day of the ablation.  PROCEDURE  An ablation is usually performed in a catheterization laboratory  with the guidance of fluoroscopy. Fluoroscopy is a type of X-ray that helps your health care provider see images of your heart during the procedure.   An ablation is a minimally invasive procedure. This means a small cut (incision) is made in either your neck or  groin. Your health care provider will decide where to make the incision based on your medical history and physical exam.  An IV tube will be started before the procedure begins. You will be given an anesthetic or medicine to help you relax (sedative).  The skin on your neck or groin will be numbed. A needle will be inserted into a large vein in your neck or groin and catheters will be threaded to your heart.  A special dye that shows up on fluoroscopy pictures may be injected through the catheter. The dye helps your health care provider see the area of the heart that needs treatment.  The catheter has electrodes on the tip. When the area of heart tissue that is causing the arrhythmia is found, the catheter tip will send an electrical current to the area and "scar" the tissue. Three types of energy can be used to ablate the heart tissue:   Heat (radiofrequency energy).   Laser energy.   Extreme cold (cryoablation).   When the area of the heart has been ablated, the catheter will be taken out. Pressure will be held on the insertion site. This will help the insertion site clot and keep it from bleeding. A bandage will be placed on the insertion site.  AFTER THE PROCEDURE   After the procedure, you will be taken to a recovery area where your vital signs (blood pressure, heart rate, and breathing) will be monitored. The insertion site will also be monitored for bleeding.   You will need to lie still for 4-6 hours. This is to ensure you do not bleed from the catheter insertion site.    This information is not intended to replace advice given to you by your health care provider. Make sure you discuss any questions you have with your health care provider.   Document Released: 10/19/2008 Document Revised: 06/23/2014 Document Reviewed: 10/25/2012 Elsevier Interactive Patient Education [employer]2016 Elsevier Inc.

## 2016-03-25 NOTE — Progress Notes (Signed)
Electrophysiology Office Note   Date:  03/25/2016   ID:  [name], DOB 12/08/1975, MRN [id number]  PCP:  [name]  Cardiologist:  [name] Primary Electrophysiologist:  [name]    Chief Complaint  Patient presents with  . Advice Only    SVT     History of Present Illness: [name] is a 40 y.o. female who presents today for electrophysiology evaluation.   Hx nephrotic syndrome presenting with episodes of SVT. Occasional palpitatisn for years.  Occasional near syncope associated with heart racing. Has lasted as long as 30 minutes. She says that there are no exacerbating or alleviating factors. She did try Valsalva maneuver which did not help her symptoms. She says that she got very dizzy, lightheaded, with a severe pounding in her chest. She was not able to stand up to get a cold washcloth or splashed cold water on her face. Her alive core monitor showed SVT at the time. She says that she has had episodes since she was young, but this was certainly the worst of the episodes.  Of note, she reports becoming hypotensive and bradycardic during her most recent C-section.  Today, she denies symptoms of palpitations, chest pain, shortness of breath, orthopnea, PND, lower extremity edema, claudication, dizziness, presyncope, syncope, bleeding, or neurologic sequela. The patient is tolerating medications without difficulties and is otherwise without complaint today.    Past Medical History:  Diagnosis Date  . DIZZINESS 03/07/2010  . NEPHROTIC SYNDROME 04/19/2009   Past Surgical History:  Procedure Laterality Date  . None    . WISDOM TOOTH EXTRACTION       Current Outpatient Prescriptions  Medication Sig Dispense Refill  . ibuprofen (ADVIL,MOTRIN) 200 MG tablet Take 200 mg by mouth as needed (for pain).    . Multiple Vitamin (MULTIVITAMIN) tablet Take 1 tablet by mouth daily.     No current facility-administered medications for this visit.      Allergies:   Review of patient's allergies indicates no known allergies.   Social History:  The patient  reports that she has never smoked. She has never used smokeless tobacco. She reports that she does not drink alcohol or use drugs.   Family History:  The patient's family history includes Diabetes in her father.    ROS:  Please see the history of present illness.   Otherwise, review of systems is positive for none.   All other systems are reviewed and negative.    PHYSICAL EXAM: VS:  BP 106/72   Pulse 62   Ht 5\' 2"  (1.575 m)   Wt 147 lb 9.6 oz (67 kg)   BMI 27.00 kg/m  , BMI Body mass index is 27 kg/m. GEN: Well nourished, well developed, in no acute distress  HEENT: normal  Neck: no JVD, carotid bruits, or masses Cardiac: RRR; no murmurs, rubs, or gallops,no edema  Respiratory:  clear to auscultation bilaterally, normal work of breathing GI: soft, nontender, nondistended, + BS MS: no deformity or atrophy  Skin: warm and dry Neuro:  Strength and sensation are intact Psych: euthymic mood, full affect  EKG:  EKG is not ordered today. Personal review of the ekg ordered 02/20/16 shows sinus arrhythmia, rate 74  [employer] shows evidence of narrow complex short RP tachycardia  Recent Labs: 02/20/2016: BUN 11; Creat 0.73; Hemoglobin 12.6; Platelets 260; Potassium 4.1; Sodium 139; TSH 1.81    Lipid Panel  No results found for: CHOL, TRIG, HDL, CHOLHDL, VLDL, LDLCALC, LDLDIRECT  Wt Readings from Last 3 Encounters:  03/25/16 147 lb 9.6 oz (67 kg)  02/20/16 143 lb 3.2 oz (65 kg)  12/20/15 141 lb 9.6 oz (64.2 kg)      Other studies Reviewed: Additional studies/ records that were reviewed today include: TTE 2013  Review of the above records today demonstrates:  Left ventricle: The cavity size was normal. Wall thickness was normal. The estimated ejection fraction was 55%. Wall motion was normal; there were no regional wall motion abnormalities. Left ventricular diastolic  function parameters were normal.     ASSESSMENT AND PLAN:  1.  SVT: 2 episodes of tachycardia that have lasted between 5 and 30 minutes.  Occasional seconds of palpitations thought due to PVCs. [employer] shows evidence of SVT. Discussed Options of ablation versus medical management. Unfortunately her low blood pressure limits the medical management that is possible. Risks and benefits of ablation were discussed. Risks include bleeding, tamponade, heart block, and stroke. She understands these risks and has agreed to ablation.    Current medicines are reviewed at length with the patient today.   The patient does not have concerns regarding her medicines.  The following changes were made today:  none  Labs/ tests ordered today include:  Orders Placed This Encounter  Procedures  . Basic metabolic panel  . CBC w/Diff     Disposition:   FU with [name] 3 months  Signed, Erika Cloninger Jorja Loa, [name]  03/25/2016 3:26 PM     [facility] [address] [place] [address] [phone] (office) [phone] (fax)

## 2016-03-27 DIAGNOSIS — M9903 Segmental and somatic dysfunction of lumbar region: Secondary | ICD-10-CM | POA: Diagnosis not present

## 2016-03-27 DIAGNOSIS — M9904 Segmental and somatic dysfunction of sacral region: Secondary | ICD-10-CM | POA: Diagnosis not present

## 2016-03-27 DIAGNOSIS — M545 Low back pain: Secondary | ICD-10-CM | POA: Diagnosis not present

## 2016-03-27 DIAGNOSIS — M62830 Muscle spasm of back: Secondary | ICD-10-CM | POA: Diagnosis not present

## 2016-04-01 DIAGNOSIS — M545 Low back pain: Secondary | ICD-10-CM | POA: Diagnosis not present

## 2016-04-01 DIAGNOSIS — M9903 Segmental and somatic dysfunction of lumbar region: Secondary | ICD-10-CM | POA: Diagnosis not present

## 2016-04-01 DIAGNOSIS — M9904 Segmental and somatic dysfunction of sacral region: Secondary | ICD-10-CM | POA: Diagnosis not present

## 2016-04-01 DIAGNOSIS — M62830 Muscle spasm of back: Secondary | ICD-10-CM | POA: Diagnosis not present

## 2016-04-02 ENCOUNTER — Ambulatory Visit: Payer: BLUE CROSS/BLUE SHIELD | Admitting: Cardiovascular Disease

## 2016-04-03 ENCOUNTER — Encounter: Payer: Self-pay | Admitting: Cardiology

## 2016-04-03 DIAGNOSIS — M62830 Muscle spasm of back: Secondary | ICD-10-CM | POA: Diagnosis not present

## 2016-04-03 DIAGNOSIS — M9903 Segmental and somatic dysfunction of lumbar region: Secondary | ICD-10-CM | POA: Diagnosis not present

## 2016-04-03 DIAGNOSIS — M545 Low back pain: Secondary | ICD-10-CM | POA: Diagnosis not present

## 2016-04-03 DIAGNOSIS — M9904 Segmental and somatic dysfunction of sacral region: Secondary | ICD-10-CM | POA: Diagnosis not present

## 2016-04-07 ENCOUNTER — Encounter (HOSPITAL_COMMUNITY)
Admission: RE | Disposition: A | Discharge status: Home / Self Care | Payer: Self-pay | Source: Ambulatory Visit | Attending: Cardiology

## 2016-04-07 ENCOUNTER — Ambulatory Visit (HOSPITAL_COMMUNITY)
Admission: RE | Admit: 2016-04-07 | Discharge: 2016-04-08 | Disposition: A | Discharge status: Home / Self Care | Payer: BLUE CROSS/BLUE SHIELD | Source: Ambulatory Visit | Attending: Cardiology | Admitting: Cardiology

## 2016-04-07 ENCOUNTER — Encounter (HOSPITAL_COMMUNITY): Payer: Self-pay | Admitting: *Deleted

## 2016-04-07 DIAGNOSIS — I471 Supraventricular tachycardia: Secondary | ICD-10-CM | POA: Diagnosis present

## 2016-04-07 HISTORY — PX: ELECTROPHYSIOLOGIC STUDY: SHX172A

## 2016-04-07 LAB — HCG, SERUM, QUALITATIVE: PREG SERUM: NEGATIVE

## 2016-04-07 SURGERY — A-FLUTTER/A-TACH/SVT ABLATION

## 2016-04-07 MED ORDER — BUPIVACAINE HCL (PF) 0.25 % IJ SOLN
INTRAMUSCULAR | Status: AC
  Filled 2016-04-07: qty 30

## 2016-04-07 MED ORDER — FENTANYL CITRATE (PF) 100 MCG/2ML IJ SOLN
INTRAMUSCULAR | Status: DC | PRN
  Administered 2016-04-07: 12.5 ug via INTRAVENOUS
  Administered 2016-04-07 (×3): 25 ug via INTRAVENOUS

## 2016-04-07 MED ORDER — MIDAZOLAM HCL 5 MG/5ML IJ SOLN
INTRAMUSCULAR | Status: DC | PRN
  Administered 2016-04-07 (×3): 1 mg via INTRAVENOUS
  Administered 2016-04-07: 2 mg via INTRAVENOUS
  Administered 2016-04-07: 1 mg via INTRAVENOUS

## 2016-04-07 MED ORDER — ADULT MULTIVITAMIN W/MINERALS CH
1.0000 | ORAL_TABLET | Freq: Every day | ORAL | Status: DC
  Administered 2016-04-08: 1 via ORAL
  Filled 2016-04-07: qty 1

## 2016-04-07 MED ORDER — MIDAZOLAM HCL 5 MG/5ML IJ SOLN
INTRAMUSCULAR | Status: AC
  Filled 2016-04-07: qty 5

## 2016-04-07 MED ORDER — ONDANSETRON HCL 4 MG/2ML IJ SOLN: 4.0000 mg | Freq: Four times a day (QID) | INTRAMUSCULAR | Status: DC | PRN

## 2016-04-07 MED ORDER — FENTANYL CITRATE (PF) 100 MCG/2ML IJ SOLN
INTRAMUSCULAR | Status: AC
  Filled 2016-04-07: qty 2

## 2016-04-07 MED ORDER — ISOPROTERENOL HCL 0.2 MG/ML IJ SOLN
INTRAMUSCULAR | Status: AC
  Filled 2016-04-07: qty 5

## 2016-04-07 MED ORDER — ACETAMINOPHEN 325 MG PO TABS: 650.0000 mg | ORAL_TABLET | ORAL | Status: DC | PRN

## 2016-04-07 MED ORDER — SODIUM CHLORIDE 0.9 % IV SOLN
INTRAVENOUS | Status: DC | PRN
  Administered 2016-04-07: 2 ug/min via INTRAVENOUS

## 2016-04-07 MED ORDER — SODIUM CHLORIDE 0.9% FLUSH: 3.0000 mL | INTRAVENOUS | Status: DC | PRN

## 2016-04-07 MED ORDER — IBUPROFEN 600 MG PO TABS
600.0000 mg | ORAL_TABLET | Freq: Three times a day (TID) | ORAL | Status: DC | PRN
  Administered 2016-04-07: 600 mg via ORAL
  Filled 2016-04-07: qty 1

## 2016-04-07 MED ORDER — SODIUM CHLORIDE 0.9 % IV SOLN: 250.0000 mL | INTRAVENOUS | Status: DC | PRN

## 2016-04-07 MED ORDER — SODIUM CHLORIDE 0.9% FLUSH: 3.0000 mL | Freq: Two times a day (BID) | INTRAVENOUS | Status: DC

## 2016-04-07 MED ORDER — HEPARIN (PORCINE) IN NACL 2-0.9 UNIT/ML-% IJ SOLN
INTRAMUSCULAR | Status: DC | PRN
  Administered 2016-04-07: 14:00:00

## 2016-04-07 MED ORDER — BUPIVACAINE HCL (PF) 0.25 % IJ SOLN
INTRAMUSCULAR | Status: DC | PRN
  Administered 2016-04-07: 30 mL

## 2016-04-07 SURGICAL SUPPLY — 12 items
BAG SNAP BAND KOVER 36X36 (MISCELLANEOUS) ×3 IMPLANT
CATH EZ STEER NAV 4MM D-F CUR (ABLATOR) ×2 IMPLANT
CATH JOSEPHSON QUAD-ALLRED 6FR (CATHETERS) ×4 IMPLANT
CATH WEBSTER BI DIR CS D-F CRV (CATHETERS) ×2 IMPLANT
PACK EP LATEX FREE (CUSTOM PROCEDURE TRAY) ×3
PACK EP LF (CUSTOM PROCEDURE TRAY) ×1 IMPLANT
PAD DEFIB LIFELINK (PAD) ×3 IMPLANT
PATCH CARTO3 (PAD) ×3 IMPLANT
SHEATH PINNACLE 6F 10CM (SHEATH) ×5 IMPLANT
SHEATH PINNACLE 7F 10CM (SHEATH) ×3 IMPLANT
SHEATH PINNACLE 8F 10CM (SHEATH) ×3 IMPLANT
SHIELD RADPAD SCOOP 12X17 (MISCELLANEOUS) ×3 IMPLANT

## 2016-04-07 NOTE — Progress Notes (Signed)
At 1944 pt had 5 bts wide QRS asymptomatic E.Smith NP notified via text page. Will continue to monitor. Dierdre HighmanHall, Arty Lantzy Marie, RN

## 2016-04-07 NOTE — H&P (Signed)
[name] is a 89Sylvan Cheese9 y.o. female with a history of SVT.  She presents today for ablation.  On exam, regular rhythm, no murmurs, lungs clear.  Procedure was discussed with the patient and family.  Risks and benefits discussed.  Risks include but not limited to bleeding, tamponade, heart block, stroke.  Patient and family understand the risks and have agreed to the procedure.  Emilyanne Mcgough Elberta Fortisamnitz, MD 04/07/2016 12:07 PM

## 2016-04-07 NOTE — Progress Notes (Signed)
Site area: LFV x 2 Site Prior to Removal:  Level 0 Pressure Applied For:2820min Manual:  yes  Patient Status During Pull:  stable Post Pull Site:  Level 0 Post Pull Instructions Given: yes  Post Pull Pulses Present: palpable Dressing Applied:  tegaderm Bedrest begins @ 1550 till 2150 Comments:by Hayley

## 2016-04-07 NOTE — Progress Notes (Addendum)
Site area: RFV x 2 Site Prior to Removal:  Level 0 Pressure Applied For:20 min Manual: yes   Patient Status During Pull:  stable Post Pull Site:  Level Post Pull Instructions Given:  yes Post Pull Pulses Present: palpable Dressing Applied:  tegaderm Bedrest begins @ see LFV note Comments:by Hayley

## 2016-04-08 ENCOUNTER — Encounter (HOSPITAL_COMMUNITY): Payer: Self-pay | Admitting: Cardiology

## 2016-04-08 DIAGNOSIS — I471 Supraventricular tachycardia: Secondary | ICD-10-CM | POA: Diagnosis not present

## 2016-04-08 NOTE — Discharge Summary (Signed)
ELECTROPHYSIOLOGY PROCEDURE DISCHARGE SUMMARY    Patient ID: [name],  MRN: [id number], DOB/AGE: 38/09/1975 38 y.o.  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  Electrophysiology study and radiofrequency catheter ablation on 04/07/16 by Dr Elberta Fortisamnitz.  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  There were no inducible arrhythmias following ablation and no early apparent complications.   Brief HPI: [name] is a 40 y.o. female with a past medical history as outlined above.  She has had increasing tachypalpitations with documented SVT. Risks, benefits, and alternatives to ablation were reviewed with the patient who wished to proceed.   Hospital Course:  The patient was admitted and underwent EPS/RFCA with details as outlined above. She was monitored on telemetry overnight which demonstrated sinus rhythm.  Groin neck incision was without complication.  They were examined and considered stable for discharge to home.  Follow up Geno Sydnor be arranged in 4 weeks.  Wound care and restrictions were reviewed with the patient prior to discharge.   Physical Exam: Vitals:   04/07/16 1722 04/07/16 1752 04/07/16 2026 04/08/16 0436  BP: 101/75 92/66 100/68 100/61  Pulse: 79 92 92 68  Resp: 16 19 (!) 21 (!) 26  Temp:   99 F (37.2 C) 98.6 F (37 C)  TempSrc:   Oral Oral  SpO2: 100% 99% 99% 100%  Weight:    143 lb (64.9 kg)  Height:        GEN- The patient is well appearing, alert and oriented x 3 today.   HEENT: normocephalic, atraumatic; sclera clear, conjunctiva pink; hearing intact; oropharynx clear; neck supple  Lungs- Clear to ausculation bilaterally, normal work of  breathing.  No wheezes, rales, rhonchi Heart- Regular rate and rhythm  GI- soft, non-tender, non-distended, bowel sounds present  Extremities- no clubbing, cyanosis, or edema; DP/PT/radial pulses 2+ bilaterally, groin without hematoma/bruit MS- no significant deformity or atrophy Skin- warm and dry, no rash or lesion Psych- euthymic mood, full affect Neuro- strength and sensation are intact   Discharge Vitals: Blood pressure 100/61, pulse 68, temperature 98.6 F (37 C), temperature source Oral, resp. rate (!) 26, height 5\' 2"  (1.575 m), weight 143 lb (64.9 kg), last menstrual period 03/26/2016, SpO2 100 %.   Labs:   Lab Results  Component Value Date   WBC 4.6 03/25/2016   HGB 12.3 03/25/2016   HCT 36.0 03/25/2016   MCV 89.8 03/25/2016   PLT 275 03/25/2016   No results for input(s): NA, K, CL, CO2, BUN, CREATININE, CALCIUM, PROT, BILITOT, ALKPHOS, ALT, AST, GLUCOSE in the last 168 hours.  Invalid input(s): LABALBU  Discharge Medications:    Medication List    TAKE these medications   ibuprofen 200 MG tablet Commonly known as:  ADVIL,MOTRIN Take 600 mg by mouth every 8 (eight) hours as needed for mild pain (for pain).   multivitamin tablet Take 1 tablet by mouth daily.       Disposition:  Discharge Instructions    Diet - low sodium heart healthy    Complete by:  As directed    Increase activity slowly    Complete by:  As directed      Follow-up Information    [facility] [address] [facility] Follow up on 05/13/2016.  Specialty:  Cardiology Why:  at 9:30AM Contact information: [address] [place] [address] [phone]          Duration of Discharge Encounter: Greater than 30 minutes including physician time.  Signed, Gypsy Balsam, NP 04/08/2016 7:25 AM  I have seen and examined this patient with Gypsy Balsam.  Agree with above, note added to reflect my findings.  On exam, regular rhythm, no murmurs, lungs clear. Had  ablation for AVNRT.  No issues post ablation.  Did have episode of tachycardia post ablation at 110 bpm but was short lived and did not appear consistent with her prior rhythm.  Azaela Caracci thus plan for discharge today with follow up in clinic.    Lauria Depoy M. Nayquan Evinger MD 04/08/2016 10:19 AM

## 2016-04-08 NOTE — Discharge Instructions (Signed)
No driving for 3 days. No lifting over 5 lbs for 1 week. No sexual activity for 1 week. You may return to work in 1 week. Keep procedure site clean & dry. If you notice increased pain, swelling, bleeding or pus, call/return!  You may shower, but no soaking baths/hot tubs/pools for 1 week.      Cardiac Ablation  Cardiac ablation is a procedure to stop some heart tissue from causing problems. The heart has many electrical connections. Sometimes these connections cause the heart to beat very fast or irregularly. Removing some of the problem areas can improve heart rhythm or make it normal. Ablation is done for people who:   Have Wolff-Parkinson-White syndrome.  Have other fast heart rhythms (tachycardia).  Have taken medicines for an abnormal heart rhythm (arrhythmia) and the medicines had:  No success.  Side effects.  May have a type of heartbeat that could cause death. BEFORE THE PROCEDURE   Follow instructions from your doctor about eating and drinking before the procedure.  Take your medicines as told by your doctor. Take them at regular times with water unless told differently by your doctor.  If you are taking diabetes medicine, ask your doctor how to take it. Ask if there are any special instructions you should follow. Your doctor may change how much insulin you take the day of the procedure. PROCEDURE   A special type of X-ray will be used. The X-ray helps your doctor see images of your heart during the procedure.  A small cut (incision) will be made in your neck or groin.  An IV tube will be started before the procedure begins.  You will be given a numbing medicine (anesthetic) or a medicine to help you relax (sedative).  The skin on your neck or groin will be numbed.  A needle will be put into a large vein in your neck or groin.  A thin, flexible tube (catheter) will be put in to reach your heart.  A dye will be put in the tube. The dye will show up on X-rays. It will  help your doctor see the area of the heart that needs treatment.  When the heart tissue that is causing problems is found, the tip of the tube will send an electrical current to it. This will stop it from causing problems.  The tube will be taken out.  Pressure will be put on the area where the tube was. This will keep it from bleeding. A bandage will be placed over the area. AFTER THE PROCEDURE  You will be taken to a recovery area. Your blood pressure, heart rate, and breathing will be watched. The area where the tube was will also be watched for bleeding.  You will need to lie still for 4-6 hours. This keeps the area where the tube was from bleeding.   This information is not intended to replace advice given to you by your health care provider. Make sure you discuss any questions you have with your health care provider.   Document Released: 02/02/2013 Document Revised: 06/23/2014 Document Reviewed: 02/02/2013 Elsevier Interactive Patient Education 2016 Elsevier Inc.    Heart-Healthy Eating Plan Many factors influence your heart health, including eating and exercise habits. Heart (coronary) risk increases with abnormal blood fat (lipid) levels. Heart-healthy meal planning includes limiting unhealthy fats, increasing healthy fats, and making other small dietary changes. This includes maintaining a healthy body weight to help keep lipid levels within a normal range. WHAT IS MY PLAN?  Your health care provider recommends that you:  Get no more than _________% of the total calories in your daily diet from fat.  Limit your intake of saturated fat to less than _________% of your total calories each day.  Limit the amount of cholesterol in your diet to less than _________ mg per day. WHAT TYPES OF FAT SHOULD I CHOOSE?  Choose healthy fats more often. Choose monounsaturated and polyunsaturated fats, such as olive oil and canola oil, flaxseeds, walnuts, almonds, and seeds.  Eat more  omega-3 fats. Good choices include salmon, mackerel, sardines, tuna, flaxseed oil, and ground flaxseeds. Aim to eat fish at least two times each week.  Limit saturated fats. Saturated fats are primarily found in animal products, such as meats, butter, and cream. Plant sources of saturated fats include palm oil, palm kernel oil, and coconut oil.  Avoid foods with partially hydrogenated oils in them. These contain trans fats. Examples of foods that contain trans fats are stick margarine, some tub margarines, cookies, crackers, and other baked goods. WHAT GENERAL GUIDELINES DO I NEED TO FOLLOW?  Check food labels carefully to identify foods with trans fats or high amounts of saturated fat.  Fill one half of your plate with vegetables and green salads. Eat 4-5 servings of vegetables per day. A serving of vegetables equals 1 cup of raw leafy vegetables,  cup of raw or cooked cut-up vegetables, or  cup of vegetable juice.  Fill one fourth of your plate with whole grains. Look for the word "whole" as the first word in the ingredient list.  Fill one fourth of your plate with lean protein foods.  Eat 4-5 servings of fruit per day. A serving of fruit equals one medium whole fruit,  cup of dried fruit,  cup of fresh, frozen, or canned fruit, or  cup of 100% fruit juice.  Eat more foods that contain soluble fiber. Examples of foods that contain this type of fiber are apples, broccoli, carrots, beans, peas, and barley. Aim to get 20-30 g of fiber per day.  Eat more home-cooked food and less restaurant, buffet, and fast food.  Limit or avoid alcohol.  Limit foods that are high in starch and sugar.  Avoid fried foods.  Cook foods by using methods other than frying. Baking, boiling, grilling, and broiling are all great options. Other fat-reducing suggestions include:  Removing the skin from poultry.  Removing all visible fats from meats.  Skimming the fat off of stews, soups, and gravies before  serving them.  Steaming vegetables in water or broth.  Lose weight if you are overweight. Losing just 5-10% of your initial body weight can help your overall health and prevent diseases such as diabetes and heart disease.  Increase your consumption of nuts, legumes, and seeds to 4-5 servings per week. One serving of dried beans or legumes equals  cup after being cooked, one serving of nuts equals 1 ounces, and one serving of seeds equals  ounce or 1 tablespoon.  You may need to monitor your salt (sodium) intake, especially if you have high blood pressure. Talk with your health care provider or dietitian to get more information about reducing sodium. WHAT FOODS CAN I EAT? Grains Breads, including [place], white, pita, wheat, raisin, rye, oatmeal, and [place]. Tortillas that are neither fried nor made with lard or trans fat. Low-fat rolls, including hotdog and hamburger buns and English muffins. Biscuits. Muffins. Waffles. Pancakes. Light popcorn. Whole-grain cereals. Flatbread. Melba toast. Pretzels. Breadsticks. Rusks. Low-fat snacks  and crackers, including oyster, saltine, matzo, graham, animal, and rye. Rice and pasta, including brown rice and those that are made with whole wheat. Vegetables All vegetables. Fruits All fruits, but limit coconut. Meats and Other Protein Sources Lean, well-trimmed beef, veal, pork, and lamb. Chicken and [place] without skin. All fish and shellfish. Wild duck, rabbit, pheasant, and venison. Egg whites or low-cholesterol egg substitutes. Dried beans, peas, lentils, and tofu.Seeds and most nuts. Dairy Low-fat or nonfat cheeses, including ricotta, string, and mozzarella. Skim or 1% milk that is liquid, powdered, or evaporated. Buttermilk that is made with low-fat milk. Nonfat or low-fat yogurt. Beverages Mineral water. Diet carbonated beverages. Sweets and Desserts Sherbets and fruit ices. Honey, jam, marmalade, jelly, and syrups. Meringues and gelatins. Pure  sugar candy, such as hard candy, jelly beans, gumdrops, mints, marshmallows, and small amounts of dark chocolate. [employer]. Eat all sweets and desserts in moderation. Fats and Oils Nonhydrogenated (trans-free) margarines. Vegetable oils, including soybean, sesame, sunflower, olive, peanut, safflower, corn, canola, and cottonseed. Salad dressings or mayonnaise that are made with a vegetable oil. Limit added fats and oils that you use for cooking, baking, salads, and as spreads. Other Cocoa powder. Coffee and tea. All seasonings and condiments. The items listed above may not be a complete list of recommended foods or beverages. Contact your dietitian for more options. WHAT FOODS ARE NOT RECOMMENDED? Grains Breads that are made with saturated or trans fats, oils, or whole milk. Croissants. Butter rolls. Cheese breads. Sweet rolls. Donuts. Buttered popcorn. Chow mein noodles. High-fat crackers, such as cheese or butter crackers. Meats and Other Protein Sources Fatty meats, such as hotdogs, short ribs, sausage, spareribs, bacon, ribeye roast or steak, and mutton. High-fat deli meats, such as salami and bologna. Caviar. Domestic duck and goose. Organ meats, such as kidney, liver, sweetbreads, brains, gizzard, chitterlings, and heart. Dairy Cream, sour cream, cream cheese, and creamed cottage cheese. Whole milk cheeses, including blue (bleu), [address], [place], [place], [address], [place], [address], [place], [place], and [place]. Whole or 2% milk that is liquid, evaporated, or condensed. Whole buttermilk. Cream sauce or high-fat cheese sauce. Yogurt that is made from whole milk. Beverages Regular sodas and drinks with added sugar. Sweets and Desserts Frosting. Pudding. Cookies. Cakes other than angel food cake. Candy that has milk chocolate or white chocolate, hydrogenated fat, butter, coconut, or unknown ingredients. Buttered syrups. Full-fat ice cream or ice cream drinks. Fats and Oils Gravy  that has suet, meat fat, or shortening. Cocoa butter, hydrogenated oils, palm oil, coconut oil, palm kernel oil. These can often be found in baked products, candy, fried foods, nondairy creamers, and whipped toppings. Solid fats and shortenings, including bacon fat, salt pork, lard, and butter. Nondairy cream substitutes, such as coffee creamers and sour cream substitutes. Salad dressings that are made of unknown oils, cheese, or sour cream. The items listed above may not be a complete list of foods and beverages to avoid. Contact your dietitian for more information.   This information is not intended to replace advice given to you by your health care provider. Make sure you discuss any questions you have with your health care provider.   Document Released: 03/11/2008 Document Revised: 06/23/2014 Document Reviewed: 11/24/2013 Elsevier Interactive Patient Education [employer]

## 2016-04-29 DIAGNOSIS — M9903 Segmental and somatic dysfunction of lumbar region: Secondary | ICD-10-CM | POA: Diagnosis not present

## 2016-04-29 DIAGNOSIS — M9904 Segmental and somatic dysfunction of sacral region: Secondary | ICD-10-CM | POA: Diagnosis not present

## 2016-04-29 DIAGNOSIS — M545 Low back pain: Secondary | ICD-10-CM | POA: Diagnosis not present

## 2016-04-29 DIAGNOSIS — M62830 Muscle spasm of back: Secondary | ICD-10-CM | POA: Diagnosis not present

## 2016-05-01 DIAGNOSIS — M9903 Segmental and somatic dysfunction of lumbar region: Secondary | ICD-10-CM | POA: Diagnosis not present

## 2016-05-01 DIAGNOSIS — M62830 Muscle spasm of back: Secondary | ICD-10-CM | POA: Diagnosis not present

## 2016-05-01 DIAGNOSIS — M545 Low back pain: Secondary | ICD-10-CM | POA: Diagnosis not present

## 2016-05-01 DIAGNOSIS — M9904 Segmental and somatic dysfunction of sacral region: Secondary | ICD-10-CM | POA: Diagnosis not present

## 2016-05-02 ENCOUNTER — Encounter: Payer: Self-pay | Admitting: Cardiology

## 2016-05-13 ENCOUNTER — Ambulatory Visit (INDEPENDENT_AMBULATORY_CARE_PROVIDER_SITE_OTHER): Payer: BLUE CROSS/BLUE SHIELD | Admitting: Cardiology

## 2016-05-13 ENCOUNTER — Encounter: Payer: Self-pay | Admitting: Cardiology

## 2016-05-13 VITALS — BP 102/66 | HR 82 | Ht 62.5 in | Wt 148.0 lb

## 2016-05-13 DIAGNOSIS — I471 Supraventricular tachycardia: Secondary | ICD-10-CM | POA: Diagnosis not present

## 2016-05-13 NOTE — Patient Instructions (Signed)
Medication Instructions:  Your physician recommends that you continue on your current medications as directed. Please refer to the Current Medication list given to you today.  If you need a refill on your cardiac medications before your next appointment, please call your pharmacy.   Labwork: None ordered  Testing/Procedures: None ordered  Follow-Up: Your physician wants you to follow-up in: 6 months with Dr. Camnitz.  You will receive a reminder letter in the mail two months in advance. If you don't receive a letter, please call our office to schedule the follow-up appointment.  Thank you for choosing [facility]!!   Latoshia Monrroy, RN [phone]         

## 2016-05-13 NOTE — Progress Notes (Signed)
Electrophysiology Office Note   Date:  05/13/2016   ID:  Erika BailiffScarlett C Windish, DOB 09/09/1975, MRN [id number]  PCP:  [name],Erika W, MD  Cardiologist:  [name] Primary Electrophysiologist:  Erika Podolak Jorja LoaMartin Jonah Nestle, MD    Chief Complaint  Patient presents with  . Follow-up    SVT/post ablation     History of Present Illness: [name] is a 40 y.o. female who presents today for electrophysiology evaluation.   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 She started to feel better after the ablation. After the ablation, she was having a few episodes of chest pressure. 2 weeks after the ablation she had an episode where she thought the tachycardia was going to start, as she felt palpitations for a second or 2, and then no further episodes. She is otherwise feeling well today without any major issues.  Today, she denies symptoms of palpitations, chest pain, shortness of breath, orthopnea, PND, lower extremity edema, claudication, dizziness, presyncope, syncope, bleeding, or neurologic sequela. The patient is tolerating medications without difficulties and is otherwise without complaint today.    Past Medical History:  Diagnosis Date  . DIZZINESS 03/07/2010  . NEPHROTIC SYNDROME 04/19/2009   Past Surgical History:  Procedure Laterality Date  . ELECTROPHYSIOLOGIC STUDY N/A 04/07/2016   Procedure: SVT Ablation;  Surgeon: Alsie Younes Jorja LoaMartin Lamerle Jabs, MD;  Location: [facility];  Service: Cardiovascular;  Laterality: N/A;  . None    . WISDOM TOOTH EXTRACTION       Current Outpatient Prescriptions  Medication Sig Dispense Refill  . ibuprofen (ADVIL,MOTRIN) 200 MG tablet Take 600 mg by mouth every 8 (eight) hours as needed for mild pain (for pain).     . Multiple Vitamin (MULTIVITAMIN) tablet Take 1 tablet by mouth daily.     No current facility-administered medications for this visit.     Allergies:   Patient has no known allergies.    Social History:  The patient  reports that she has never smoked. She has never used smokeless tobacco. She reports that she does not drink alcohol or use drugs.   Family History:  The patient's family history includes Diabetes in her father.    ROS:  Please see the history of present illness.   Otherwise, review of systems is positive for none.   All other systems are reviewed and negative.    PHYSICAL EXAM: VS:  BP 102/66   Pulse 82   Ht 5' 2.5" (1.588 m)   Wt 148 lb (67.1 kg)   BMI 26.64 kg/m  , BMI Body mass index is 26.64 kg/m. GEN: Well nourished, well developed, in no acute distress  HEENT: normal  Neck: no JVD, carotid bruits, or masses Cardiac: RRR; no murmurs, rubs, or gallops,no edema  Respiratory:  clear to auscultation bilaterally, normal work of breathing GI: soft, nontender, nondistended, + BS MS: no deformity or atrophy  Skin: warm and dry Neuro:  Strength and sensation are intact Psych: euthymic mood, full affect  EKG:  EKG is ordered today. Personal review of the ekg ordered shows sinus arrhythmia, rate 82, short PR 106 ms  AliveCor shows evidence of narrow complex short RP tachycardia  Recent Labs: 02/20/2016: TSH 1.81 03/25/2016: BUN 14; Creat 0.67; Hemoglobin 12.3; Platelets 275; Potassium 4.0; Sodium 140    Lipid Panel  No results found for: CHOL, TRIG, HDL, CHOLHDL, VLDL, LDLCALC, LDLDIRECT   Wt Readings from Last 3 Encounters:  05/13/16 148 lb (67.1 kg)  04/08/16 143 lb (64.9 kg)  03/25/16 147 lb 9.6 oz (67 kg)      Other studies Reviewed: Additional studies/ records that were reviewed today include: TTE 2013  Review of the above records today demonstrates:  Left ventricle: The cavity size was normal. Wall thickness was normal. The estimated ejection fraction was 55%. Wall motion was normal; there were no regional wall motion abnormalities. Left ventricular diastolic function parameters were normal.     ASSESSMENT AND PLAN:  1.   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  She has continued to have episodes of palpitations, though they are not as severe as they were. She has had one episode since ablation that lasted a second or 2. She is feeling well today not having any major issues. We Toshio Slusher follow up in 6 months to determine if she is having any further episodes.  Current medicines are reviewed at length with the patient today.   The patient does not have concerns regarding her medicines.  The following changes were made today:  none  Labs/ tests ordered today include:  Orders Placed This Encounter  Procedures  . EKG 12-Lead     Disposition:   FU with Jamillah Camilo 6 months  Signed, Milcah Dulany Jorja LoaMartin Damesha Lawler, MD  05/13/2016 10:10 AM     [facility] [address] [place] [address] [phone] (office) [phone] (fax)

## 2016-05-15 DIAGNOSIS — M545 Low back pain: Secondary | ICD-10-CM | POA: Diagnosis not present

## 2016-05-15 DIAGNOSIS — M9904 Segmental and somatic dysfunction of sacral region: Secondary | ICD-10-CM | POA: Diagnosis not present

## 2016-05-15 DIAGNOSIS — M9903 Segmental and somatic dysfunction of lumbar region: Secondary | ICD-10-CM | POA: Diagnosis not present

## 2016-05-15 DIAGNOSIS — M62830 Muscle spasm of back: Secondary | ICD-10-CM | POA: Diagnosis not present

## 2016-05-20 DIAGNOSIS — M9904 Segmental and somatic dysfunction of sacral region: Secondary | ICD-10-CM | POA: Diagnosis not present

## 2016-05-20 DIAGNOSIS — M545 Low back pain: Secondary | ICD-10-CM | POA: Diagnosis not present

## 2016-05-20 DIAGNOSIS — M9903 Segmental and somatic dysfunction of lumbar region: Secondary | ICD-10-CM | POA: Diagnosis not present

## 2016-05-20 DIAGNOSIS — M62830 Muscle spasm of back: Secondary | ICD-10-CM | POA: Diagnosis not present

## 2016-05-22 DIAGNOSIS — M545 Low back pain: Secondary | ICD-10-CM | POA: Diagnosis not present

## 2016-05-22 DIAGNOSIS — M9903 Segmental and somatic dysfunction of lumbar region: Secondary | ICD-10-CM | POA: Diagnosis not present

## 2016-05-22 DIAGNOSIS — M9904 Segmental and somatic dysfunction of sacral region: Secondary | ICD-10-CM | POA: Diagnosis not present

## 2016-05-22 DIAGNOSIS — M62830 Muscle spasm of back: Secondary | ICD-10-CM | POA: Diagnosis not present

## 2016-05-26 DIAGNOSIS — M545 Low back pain: Secondary | ICD-10-CM | POA: Diagnosis not present

## 2016-05-26 DIAGNOSIS — M9903 Segmental and somatic dysfunction of lumbar region: Secondary | ICD-10-CM | POA: Diagnosis not present

## 2016-05-26 DIAGNOSIS — M9904 Segmental and somatic dysfunction of sacral region: Secondary | ICD-10-CM | POA: Diagnosis not present

## 2016-05-26 DIAGNOSIS — M62830 Muscle spasm of back: Secondary | ICD-10-CM | POA: Diagnosis not present

## 2016-05-29 DIAGNOSIS — M62830 Muscle spasm of back: Secondary | ICD-10-CM | POA: Diagnosis not present

## 2016-05-29 DIAGNOSIS — M545 Low back pain: Secondary | ICD-10-CM | POA: Diagnosis not present

## 2016-05-29 DIAGNOSIS — M9904 Segmental and somatic dysfunction of sacral region: Secondary | ICD-10-CM | POA: Diagnosis not present

## 2016-05-29 DIAGNOSIS — M9903 Segmental and somatic dysfunction of lumbar region: Secondary | ICD-10-CM | POA: Diagnosis not present

## 2016-06-02 DIAGNOSIS — M62830 Muscle spasm of back: Secondary | ICD-10-CM | POA: Diagnosis not present

## 2016-06-02 DIAGNOSIS — M9904 Segmental and somatic dysfunction of sacral region: Secondary | ICD-10-CM | POA: Diagnosis not present

## 2016-06-02 DIAGNOSIS — M9903 Segmental and somatic dysfunction of lumbar region: Secondary | ICD-10-CM | POA: Diagnosis not present

## 2016-06-02 DIAGNOSIS — M545 Low back pain: Secondary | ICD-10-CM | POA: Diagnosis not present

## 2016-06-04 DIAGNOSIS — M9904 Segmental and somatic dysfunction of sacral region: Secondary | ICD-10-CM | POA: Diagnosis not present

## 2016-06-04 DIAGNOSIS — M62830 Muscle spasm of back: Secondary | ICD-10-CM | POA: Diagnosis not present

## 2016-06-04 DIAGNOSIS — M545 Low back pain: Secondary | ICD-10-CM | POA: Diagnosis not present

## 2016-06-04 DIAGNOSIS — M9903 Segmental and somatic dysfunction of lumbar region: Secondary | ICD-10-CM | POA: Diagnosis not present

## 2016-06-05 DIAGNOSIS — Z23 Encounter for immunization: Secondary | ICD-10-CM | POA: Diagnosis not present

## 2016-06-05 DIAGNOSIS — M9904 Segmental and somatic dysfunction of sacral region: Secondary | ICD-10-CM | POA: Diagnosis not present

## 2016-06-05 DIAGNOSIS — M9903 Segmental and somatic dysfunction of lumbar region: Secondary | ICD-10-CM | POA: Diagnosis not present

## 2016-06-05 DIAGNOSIS — M545 Low back pain: Secondary | ICD-10-CM | POA: Diagnosis not present

## 2016-06-05 DIAGNOSIS — M62830 Muscle spasm of back: Secondary | ICD-10-CM | POA: Diagnosis not present

## 2016-07-03 ENCOUNTER — Ambulatory Visit: Payer: BLUE CROSS/BLUE SHIELD | Admitting: Cardiovascular Disease

## 2016-07-08 ENCOUNTER — Encounter: Payer: Self-pay | Admitting: Cardiovascular Disease

## 2016-07-08 ENCOUNTER — Ambulatory Visit (INDEPENDENT_AMBULATORY_CARE_PROVIDER_SITE_OTHER): Payer: BLUE CROSS/BLUE SHIELD | Admitting: Cardiovascular Disease

## 2016-07-08 VITALS — BP 98/66 | HR 75 | Ht 62.0 in | Wt 151.1 lb

## 2016-07-08 DIAGNOSIS — R0789 Other chest pain: Secondary | ICD-10-CM | POA: Diagnosis not present

## 2016-07-08 DIAGNOSIS — Z9889 Other specified postprocedural states: Secondary | ICD-10-CM | POA: Diagnosis not present

## 2016-07-08 DIAGNOSIS — I471 Supraventricular tachycardia, unspecified: Secondary | ICD-10-CM

## 2016-07-08 LAB — D-DIMER, QUANTITATIVE (NOT AT ARMC)

## 2016-07-08 NOTE — Patient Instructions (Signed)
Medication Instructions:  Your physician recommends that you continue on your current medications as directed. Please refer to the Current Medication list given to you today.   Labwork: TODAY - d dimer   Testing/Procedures: Your physician has requested that you have an echocardiogram. Echocardiography is a painless test that uses sound waves to create images of your heart. It provides your doctor with information about the size and shape of your heart and how well your heart's chambers and valves are working. This procedure takes approximately one hour. There are no restrictions for this procedure.   Follow-Up: Your physician recommends that you schedule a follow-up appointment in: 2 months with Dr. Elease HashimotoNahser   If you need a refill on your cardiac medications before your next appointment, please call your pharmacy.   Thank you for choosing [facility]! Eligha BridegroomMichelle Anupama Piehl, RN [phone]

## 2016-07-08 NOTE — Progress Notes (Signed)
Cardiology Office Note   Date:  07/08/2016   ID:  [name], DOB Aug 07, 1975, MRN [id number]  PCP:  Kristian Covey, MD  Cardiologist:   Kristeen Miss, MD   Chief Complaint  Patient presents with  . Follow-up    SVT      History of Present Illness: [name] is a 41 y.o. female who presents for evaluation of her palpitations  Has had palpitations off and on for years.  Has seen Hochrein years ago Has some occasional near syncope This is associated with "heart racing" These can last as long as 30 minutes.  Another was 5 minutes.  HR was too fast to count .  Has had brief episodes of palpitations that last only for a second or so .   Sees Dr. Caryl Never.  Has worn a heart monitor .   Did not have any episodes while she was wearing the monitor   Does not drink caffiene. Not associated with hormonal changes.  She does not sleep well at night . Has 5 children ( ages 52,11,8,6,4)   Father has some heart problems .  Father Has CAD also   Jan. 23, 2018:  Was found to have very rapid SVT Now has some very brief episodes of what used to feel like  Lasts 1-2 seconds. ( ? PVCs)  Just started doing some exercise - but has chest tightness when she trys to exercise.     Past Medical History:  Diagnosis Date  . DIZZINESS 03/07/2010  . NEPHROTIC SYNDROME 04/19/2009    Past Surgical History:  Procedure Laterality Date  . ELECTROPHYSIOLOGIC STUDY N/A 04/07/2016   Procedure: SVT Ablation;  Surgeon: Will Jorja Loa, MD;  Location: [facility];  Service: Cardiovascular;  Laterality: N/A;  . None    . WISDOM TOOTH EXTRACTION       Current Outpatient Prescriptions  Medication Sig Dispense Refill  . ibuprofen (ADVIL,MOTRIN) 200 MG tablet Take 600 mg by mouth every 8 (eight) hours as needed for mild pain (for pain).     . Multiple Vitamin (MULTIVITAMIN) tablet Take 1 tablet by mouth daily.     No current facility-administered medications for this visit.      Allergies:   Patient has no known allergies.    Social History:  The patient  reports that she has never smoked. She has never used smokeless tobacco. She reports that she does not drink alcohol or use drugs.   Family History:  The patient's family history includes Diabetes in her father.    ROS:  Please see the history of present illness.    Review of Systems: Constitutional:  denies fever, chills, diaphoresis, appetite change and fatigue.  HEENT: denies photophobia, eye pain, redness, hearing loss, ear pain, congestion, sore throat, rhinorrhea, sneezing, neck pain, neck stiffness and tinnitus.  Respiratory: denies SOB, DOE, cough, chest tightness, and wheezing.  Cardiovascular: denies chest pain, palpitations and leg swelling.  Gastrointestinal: denies nausea, vomiting, abdominal pain, diarrhea, constipation, blood in stool.  Genitourinary: denies dysuria, urgency, frequency, hematuria, flank pain and difficulty urinating.  Musculoskeletal: denies  myalgias, back pain, joint swelling, arthralgias and gait problem.   Skin: denies pallor, rash and wound.  Neurological: denies dizziness, seizures, syncope, weakness, light-headedness, numbness and headaches.   Hematological: denies adenopathy, easy bruising, personal or family bleeding history.  Psychiatric/ Behavioral: denies suicidal ideation, mood changes, confusion, nervousness, sleep disturbance and agitation.       All other systems are reviewed and  negative.    PHYSICAL EXAM: VS:  BP 98/66   Pulse 75   Ht 5\' 2"  (1.575 m)   Wt 151 lb 1.9 oz (68.5 kg)   SpO2 96%   BMI 27.64 kg/m  , BMI Body mass index is 27.64 kg/m. GEN: Well nourished, well developed, in no acute distress  HEENT: normal  Neck: no JVD, carotid bruits, or masses Cardiac: RRR; no murmurs, rubs, or gallops,no edema  Respiratory:  clear to auscultation bilaterally, normal work of breathing GI: soft, nontender, nondistended, + BS MS: no deformity or  atrophy  Skin: warm and dry, no rash Neuro:  Strength and sensation are intact Psych: normal   EKG:  EKG is ordered today. The ekg ordered today demonstrates  NSR at 74 with PACS. Otherwise normal eCG, normal PR,   No delta wave.     Recent Labs: 02/20/2016: TSH 1.81 03/25/2016: BUN 14; Creat 0.67; Hemoglobin 12.3; Platelets 275; Potassium 4.0; Sodium 140    Lipid Panel No results found for: CHOL, TRIG, HDL, CHOLHDL, VLDL, LDLCALC, LDLDIRECT    Wt Readings from Last 3 Encounters:  07/08/16 151 lb 1.9 oz (68.5 kg)  05/13/16 148 lb (67.1 kg)  04/08/16 143 lb (64.9 kg)      Other studies Reviewed: Additional studies/ records that were reviewed today include: . Review of the above records demonstrates:    ASSESSMENT AND PLAN:  1.  SVT:  Is s/p ablation. No long episodes of SVT but she has brief episodes of tachycardia that last for 1-2 seconds.  If these continue we may need to place an event monitor on her again.  2. PVCs : she has palpitations that only last for a second or so. These are likely PVCs. Will check labs today  - BMP, TSH, CBC.   3. Chest pressure:   Has chest pressure with any exertion .  Will get an echo Not short of breath at rest.  Check d-dimer.    Current medicines are reviewed at length with the patient today.  The patient does not have concerns regarding medicines.  Labs/ tests ordered today include:  No orders of the defined types were placed in this encounter.   Kristeen MissPhilip Nahser, MD  07/08/2016 12:22 PM    [facility] [address], [place], [place]  [address] Phone: [phone]; Fax: [phone]

## 2016-07-23 ENCOUNTER — Other Ambulatory Visit: Payer: Self-pay

## 2016-07-23 ENCOUNTER — Ambulatory Visit (HOSPITAL_COMMUNITY): Payer: BLUE CROSS/BLUE SHIELD | Attending: Internal Medicine

## 2016-07-23 DIAGNOSIS — R0789 Other chest pain: Secondary | ICD-10-CM | POA: Diagnosis not present

## 2016-07-23 DIAGNOSIS — I471 Supraventricular tachycardia, unspecified: Secondary | ICD-10-CM

## 2016-07-23 DIAGNOSIS — Z9889 Other specified postprocedural states: Secondary | ICD-10-CM

## 2016-09-03 ENCOUNTER — Ambulatory Visit (INDEPENDENT_AMBULATORY_CARE_PROVIDER_SITE_OTHER): Payer: BLUE CROSS/BLUE SHIELD | Admitting: Cardiovascular Disease

## 2016-09-03 ENCOUNTER — Encounter: Payer: Self-pay | Admitting: Cardiovascular Disease

## 2016-09-03 VITALS — BP 90/60 | HR 74 | Ht 62.0 in | Wt 149.0 lb

## 2016-09-03 DIAGNOSIS — I471 Supraventricular tachycardia: Secondary | ICD-10-CM | POA: Diagnosis not present

## 2016-09-03 NOTE — Patient Instructions (Signed)

## 2016-09-03 NOTE — Progress Notes (Signed)
Cardiology Office Note   Date:  09/03/2016   ID:  [name], DOB [birth year], MRN [id number]  PCP:  Kristian Covey, MD  Cardiologist:   Kristeen Miss, MD   No chief complaint on file.     History of Present Illness: [name] is a 41 y.o. female who presents for evaluation of her palpitations  Has had palpitations off and on for years.  Has seen Hochrein years ago Has some occasional near syncope This is associated with "heart racing" These can last as long as 30 minutes.  Another was 5 minutes.  HR was too fast to count .  Has had brief episodes of palpitations that last only for a second or so .   Sees Dr. Caryl Never.  Has worn a heart monitor .   Did not have any episodes while she was wearing the monitor   Does not drink caffiene. Not associated with hormonal changes.  She does not sleep well at night . Has 5 children ( ages 35,11,8,6,4)   Father has some heart problems .  Father Has CAD also   Jan. 23, 2018:  Was found to have very rapid SVT Now has some very brief episodes of what used to feel like  Lasts 1-2 seconds. ( ? PVCs)  Just started doing some exercise - but has chest tightness when she trys to exercise.    September 03, 2016:    Has some rare palpitations. Has occasional dizzy spells .   Was up walking  Has had this occur while driving - she gets nauseated and occasionally throws up  These are very rare - several times a year   Past Medical History:  Diagnosis Date  . DIZZINESS 03/07/2010  . NEPHROTIC SYNDROME 04/19/2009    Past Surgical History:  Procedure Laterality Date  . ELECTROPHYSIOLOGIC STUDY N/A 04/07/2016   Procedure: SVT Ablation;  Surgeon: Will Jorja Loa, MD;  Location: [facility];  Service: Cardiovascular;  Laterality: N/A;  . None    . WISDOM TOOTH EXTRACTION       Current Outpatient Prescriptions  Medication Sig Dispense Refill  . ibuprofen (ADVIL,MOTRIN) 200 MG tablet Take 600 mg by mouth every 8  (eight) hours as needed for mild pain (for pain).     . Multiple Vitamin (MULTIVITAMIN) tablet Take 1 tablet by mouth daily.     No current facility-administered medications for this visit.     Allergies:   Patient has no known allergies.    Social History:  The patient  reports that she has never smoked. She has never used smokeless tobacco. She reports that she does not drink alcohol or use drugs.   Family History:  The patient's family history includes Diabetes in her father.    ROS:  Please see the history of present illness.    Review of Systems: Constitutional:  denies fever, chills, diaphoresis, appetite change and fatigue.  HEENT: denies photophobia, eye pain, redness, hearing loss, ear pain, congestion, sore throat, rhinorrhea, sneezing, neck pain, neck stiffness and tinnitus.  Respiratory: denies SOB, DOE, cough, chest tightness, and wheezing.  Cardiovascular: denies chest pain, palpitations and leg swelling.  Gastrointestinal: denies nausea, vomiting, abdominal pain, diarrhea, constipation, blood in stool.  Genitourinary: denies dysuria, urgency, frequency, hematuria, flank pain and difficulty urinating.  Musculoskeletal: denies  myalgias, back pain, joint swelling, arthralgias and gait problem.   Skin: denies pallor, rash and wound.  Neurological: denies dizziness, seizures, syncope, weakness, light-headedness, numbness and headaches.  Hematological: denies adenopathy, easy bruising, personal or family bleeding history.  Psychiatric/ Behavioral: denies suicidal ideation, mood changes, confusion, nervousness, sleep disturbance and agitation.       All other systems are reviewed and negative.    PHYSICAL EXAM: VS:  BP 90/60 (BP Location: Right Arm, Patient Position: Sitting, Cuff Size: Normal)   Pulse 74   Ht 5\' 2"  (1.575 m)   Wt 149 lb (67.6 kg)   SpO2 97%   BMI 27.25 kg/m  , BMI Body mass index is 27.25 kg/m. GEN: Well nourished, well developed, in no acute  distress  HEENT: normal  Neck: no JVD, carotid bruits, or masses Cardiac: RRR; no murmurs, rubs, or gallops,no edema  Respiratory:  clear to auscultation bilaterally, normal work of breathing GI: soft, nontender, nondistended, + BS MS: no deformity or atrophy  Skin: warm and dry, no rash Neuro:  Strength and sensation are intact Psych: normal   EKG:  EKG is ordered today. The ekg ordered today demonstrates  NSR at 74 with PACS. Otherwise normal eCG, normal PR,   No delta wave.     Recent Labs: 02/20/2016: TSH 1.81 03/25/2016: BUN 14; Creat 0.67; Hemoglobin 12.3; Platelets 275; Potassium 4.0; Sodium 140    Lipid Panel No results found for: CHOL, TRIG, HDL, CHOLHDL, VLDL, LDLCALC, LDLDIRECT    Wt Readings from Last 3 Encounters:  09/03/16 149 lb (67.6 kg)  07/08/16 151 lb 1.9 oz (68.5 kg)  05/13/16 148 lb (67.1 kg)      Other studies Reviewed: Additional studies/ records that were reviewed today include: . Review of the above records demonstrates:    ASSESSMENT AND PLAN:  1.  SVT:  Is s/p ablation. No long episodes of SVT but she has brief episodes of tachycardia that last for 1-2 seconds.  If these continue we may need to place an event monitor on her again.  2. PVCs : she has palpitations that only last for a second or so. These are likely PVCs. Will check labs today  - BMP, TSH, CBC.   3. Chest pressure:   Has chest pressure with any exertion .   Echo in Feb. 7 was normal .     Current medicines are reviewed at length with the patient today.  The patient does not have concerns regarding medicines.  Labs/ tests ordered today include:  No orders of the defined types were placed in this encounter.   Kristeen MissPhilip Nahser, MD  09/03/2016 11:09 AM    [facility] [address], [place], [place]  [address] Phone: [phone]; Fax: [phone]

## 2016-10-23 DIAGNOSIS — Z01419 Encounter for gynecological examination (general) (routine) without abnormal findings: Secondary | ICD-10-CM | POA: Diagnosis not present

## 2016-10-23 DIAGNOSIS — Z6827 Body mass index (BMI) 27.0-27.9, adult: Secondary | ICD-10-CM | POA: Diagnosis not present

## 2017-01-28 ENCOUNTER — Encounter: Payer: Self-pay | Admitting: Cardiology

## 2017-02-18 ENCOUNTER — Encounter: Payer: Self-pay | Admitting: Cardiology

## 2017-02-18 ENCOUNTER — Ambulatory Visit (INDEPENDENT_AMBULATORY_CARE_PROVIDER_SITE_OTHER): Payer: BLUE CROSS/BLUE SHIELD | Admitting: Cardiology

## 2017-02-18 VITALS — BP 100/70 | HR 96 | Ht 62.0 in | Wt 145.4 lb

## 2017-02-18 DIAGNOSIS — I471 Supraventricular tachycardia: Secondary | ICD-10-CM | POA: Diagnosis not present

## 2017-02-18 NOTE — Progress Notes (Signed)
Electrophysiology Office Note   Date:  02/18/2017   ID:  [name], DOB 12/12/1975, MRN [id number]  PCP:  Kristian CoveyBurchette, Bruce W, MD  Cardiologist:  Nahser Primary Electrophysiologist:  Janesia Joswick Jorja LoaMartin Raya Mckinstry, MD    Chief Complaint  Patient presents with  . Follow-up    SVT     History of Present Illness: [name] is a 41 y.o. female who presents today for electrophysiology evaluation.   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 She started to feel better after the ablation.   Today, denies symptoms of , chest pain, shortness of breath, orthopnea, PND, lower extremity edema, claudication, dizziness, presyncope, syncope, bleeding, or neurologic sequela. The patient is tolerating medications without difficulties. She continues to have 1-2 seconds of palpitations a few times a month. She does not feel like this requires medication management. There are no exacerbating or alleviating factors.    Past Medical History:  Diagnosis Date  . DIZZINESS 03/07/2010  . NEPHROTIC SYNDROME 04/19/2009   Past Surgical History:  Procedure Laterality Date  . ELECTROPHYSIOLOGIC STUDY N/A 04/07/2016   Procedure: SVT Ablation;  Surgeon: Dyke Weible Jorja LoaMartin Travaughn Vue, MD;  Location: [facility];  Service: Cardiovascular;  Laterality: N/A;  . None    . WISDOM TOOTH EXTRACTION       Current Outpatient Prescriptions  Medication Sig Dispense Refill  . ibuprofen (ADVIL,MOTRIN) 200 MG tablet Take 600 mg by mouth every 8 (eight) hours as needed for mild pain (for pain).     . Multiple Vitamin (MULTIVITAMIN) tablet Take 1 tablet by mouth daily.     No current facility-administered medications for this visit.     Allergies:   Patient has no known allergies.   Social History:  The patient  reports that she has never smoked. She has never used smokeless tobacco. She reports that she does not drink alcohol or use drugs.   Family History:  The patient's  family history includes Diabetes in her father.    ROS:  Please see the history of present illness.   Otherwise, review of systems is positive for limitations, back pain, dizziness, headaches.   All other systems are reviewed and negative.   PHYSICAL EXAM: VS:  BP 100/70   Pulse 96   Ht 5\' 2"  (1.575 m)   Wt 145 lb 6.4 oz (66 kg)   SpO2 98%   BMI 26.59 kg/m  , BMI Body mass index is 26.59 kg/m. GEN: Well nourished, well developed, in no acute distress  HEENT: normal  Neck: no JVD, carotid bruits, or masses Cardiac: RRR; no murmurs, rubs, or gallops,no edema  Respiratory:  clear to auscultation bilaterally, normal work of breathing GI: soft, nontender, nondistended, + BS MS: no deformity or atrophy  Skin: warm and dry Neuro:  Strength and sensation are intact Psych: euthymic mood, full affect  EKG:  EKG is not ordered today. Personal review of the ekg ordered 07/08/16 shows SR, rate 75  Recent Labs: 02/20/2016: TSH 1.81 03/25/2016: BUN 14; Creat 0.67; Hemoglobin 12.3; Platelets 275; Potassium 4.0; Sodium 140    Lipid Panel  No results found for: CHOL, TRIG, HDL, CHOLHDL, VLDL, LDLCALC, LDLDIRECT   Wt Readings from Last 3 Encounters:  02/18/17 145 lb 6.4 oz (66 kg)  09/03/16 149 lb (67.6 kg)  07/08/16 151 lb 1.9 oz (68.5 kg)      Other studies Reviewed: Additional studies/ records that were reviewed today include:TTE 07/23/16 Review of the  above records today demonstrates:  - Left ventricle: The cavity size was normal. Wall thickness was   normal. Systolic function was normal. The estimated ejection   fraction was in the range of 55% to 60%. Wall motion was normal;   there were no regional wall motion abnormalities. Left   ventricular diastolic function parameters were normal. - Left atrium: The atrium was normal in size. - Inferior vena cava: The vessel was normal in size. The   respirophasic diameter changes were in the normal range (>= 50%),   consistent with normal  central venous pressure.    ASSESSMENT AND PLAN:  1.  SVT: Ablation 04/07/16. Continues to have 1-2 seconds of palpitations once or twice a month. At this point, and no medication therapy is indicated. We'll continue current management.  Current medicines are reviewed at length with the patient today.   The patient does not have concerns regarding her medicines.  The following changes were made today:  None   Labs/ tests ordered today include:  No orders of the defined types were placed in this encounter.    Disposition:   FU with Ceria Suminski 12 months  Signed, Aksel Bencomo Jorja Loa, MD  02/18/2017 11:03 AM     [facility] [address] [place] [address] [phone] (office) [phone] (fax)

## 2017-02-18 NOTE — Patient Instructions (Signed)
Medication Instructions:  Your physician recommends that you continue on your current medications as directed. Please refer to the Current Medication list given to you today.  * If you need a refill on your cardiac medications before your next appointment, please call your pharmacy.   Labwork: None ordered  Testing/Procedures: None ordered  Follow-Up: Your physician wants you to follow-up in: 1 year with Dr. Camnitz.  You will receive a reminder letter in the mail two months in advance. If you don't receive a letter, please call our office to schedule the follow-up appointment.  Thank you for choosing [facility]!!   Jennessy Sandridge, RN [phone]        

## 2017-03-05 ENCOUNTER — Encounter: Payer: Self-pay | Admitting: Family Medicine

## 2017-03-19 ENCOUNTER — Ambulatory Visit (INDEPENDENT_AMBULATORY_CARE_PROVIDER_SITE_OTHER): Payer: BLUE CROSS/BLUE SHIELD | Admitting: Family Medicine

## 2017-03-19 ENCOUNTER — Encounter: Payer: Self-pay | Admitting: Family Medicine

## 2017-03-19 VITALS — BP 100/72 | HR 94 | Temp 98.8°F | Ht 62.0 in | Wt 148.5 lb

## 2017-03-19 DIAGNOSIS — H6692 Otitis media, unspecified, left ear: Secondary | ICD-10-CM

## 2017-03-19 DIAGNOSIS — J0100 Acute maxillary sinusitis, unspecified: Secondary | ICD-10-CM | POA: Diagnosis not present

## 2017-03-19 MED ORDER — AMOXICILLIN-POT CLAVULANATE 875-125 MG PO TABS: 1.0000 | ORAL_TABLET | Freq: Two times a day (BID) | ORAL | 0 refills | Status: DC

## 2017-03-19 MED ORDER — BENZONATATE 100 MG PO CAPS: 100.0000 mg | ORAL_CAPSULE | Freq: Two times a day (BID) | ORAL | 0 refills | Status: DC | PRN

## 2017-03-19 NOTE — Progress Notes (Signed)
HPI:  Acute visit for URI: -started: 3-4 weeks ago with flu like symptoms -symptoms:nasal congestion, sore throat, cough have persisted and now has L ear pain and thick sinus congestion and sinus pain last night -denies:fever recently, SOB, NVD, tooth pain -has tried: nothing -sick contacts/travel/risks: no reported flu, strep or tick exposure  ROS: See pertinent positives and negatives per HPI.  Past Medical History:  Diagnosis Date  . DIZZINESS 03/07/2010  . NEPHROTIC SYNDROME 04/19/2009    Past Surgical History:  Procedure Laterality Date  . ELECTROPHYSIOLOGIC STUDY N/A 04/07/2016   Procedure: SVT Ablation;  Surgeon: Will Jorja Loa, MD;  Location: [facility];  Service: Cardiovascular;  Laterality: N/A;  . None    . WISDOM TOOTH EXTRACTION      Family History  Problem Relation Age of Onset  . Diabetes Father        Type II  . Hypertension Neg Hx   . Coronary artery disease Neg Hx   . Stroke Neg Hx   . Colon cancer Neg Hx   . Anesthesia problems Neg Hx     Social History   Social History  . Marital status: Married    Spouse name: N/A  . Number of children: 4  . Years of education: N/A   Occupational History  . Stay at home mom    Social History Main Topics  . Smoking status: Never Smoker  . Smokeless tobacco: Never Used  . Alcohol use No  . Drug use: No  . Sexual activity: Not Asked   Other Topics Concern  . None   Social History Narrative  . None     Current Outpatient Prescriptions:  .  ibuprofen (ADVIL,MOTRIN) 200 MG tablet, Take 600 mg by mouth every 8 (eight) hours as needed for mild pain (for pain). , Disp: , Rfl:  .  Multiple Vitamin (MULTIVITAMIN) tablet, Take 1 tablet by mouth daily., Disp: , Rfl:  .  amoxicillin-clavulanate (AUGMENTIN) 875-125 MG tablet, Take 1 tablet by mouth 2 (two) times daily., Disp: 20 tablet, Rfl: 0 .  benzonatate (TESSALON) 100 MG capsule, Take 1 capsule (100 mg total) by mouth 2 (two) times daily as  needed for cough., Disp: 20 capsule, Rfl: 0  EXAM:  Vitals:   03/19/17 1123  BP: 100/72  Pulse: 94  Temp: 98.8 F (37.1 C)  SpO2: 98%    Body mass index is 27.16 kg/m.  GENERAL: vitals reviewed and listed above, alert, oriented, appears well hydrated and in no acute distress  HEENT: atraumatic, conjunttiva clear, no obvious abnormalities on inspection of external nose and ears, normal appearance of ear canals and TMs except for erythematous bulging L TM, thick nasal congestion, mild post oropharyngeal erythema with PND, no tonsillar edema or exudate, no sinus TTP  NECK: no obvious masses on inspection  LUNGS: clear to auscultation bilaterally, no wheezes, rales or rhonchi, good air movement  CV: HRRR, no peripheral edema  MS: moves all extremities without noticeable abnormality  PSYCH: pleasant and cooperative, no obvious depression or anxiety  ASSESSMENT AND PLAN:  Discussed the following assessment and plan:  Acute maxillary sinusitis, recurrence not specified  Left otitis media, unspecified otitis media type  -given HPI and exam findings today, a serious infection or illness is unlikely. We discussed potential etiologies, with SInusitis and L OM likely.  We discussed treatment side effects, likely course, antibiotic misuse, transmission, and signs of developing a serious illness. Abx and tessalon rx. Return precautions. She plans to  get flu shot in a few weeks. -of course, we advised to return or notify a doctor immediately if symptoms worsen or persist or new concerns arise.    Patient Instructions  Take the antibiotic as instructed.  Tessalon for cough as needed.  I hope you are feeling better soon! Seek care immediately if worsening, new concerns or you are not improving with treatment.     Kriste Basque R., DO

## 2017-03-19 NOTE — Patient Instructions (Signed)
Take the antibiotic as instructed.  Tessalon for cough as needed.  I hope you are feeling better soon! Seek care immediately if worsening, new concerns or you are not improving with treatment.

## 2017-04-08 DIAGNOSIS — M545 Low back pain: Secondary | ICD-10-CM | POA: Diagnosis not present

## 2017-04-08 DIAGNOSIS — M5136 Other intervertebral disc degeneration, lumbar region: Secondary | ICD-10-CM | POA: Diagnosis not present

## 2017-04-08 DIAGNOSIS — M549 Dorsalgia, unspecified: Secondary | ICD-10-CM | POA: Diagnosis not present

## 2017-04-14 ENCOUNTER — Other Ambulatory Visit: Payer: Self-pay | Admitting: Orthopaedic Surgery

## 2017-04-14 DIAGNOSIS — M5136 Other intervertebral disc degeneration, lumbar region: Secondary | ICD-10-CM

## 2017-04-23 ENCOUNTER — Ambulatory Visit
Admission: RE | Admit: 2017-04-23 | Discharge: 2017-04-23 | Disposition: A | Discharge status: Home / Self Care | Payer: BLUE CROSS/BLUE SHIELD | Source: Ambulatory Visit | Attending: Orthopaedic Surgery | Admitting: Orthopaedic Surgery

## 2017-04-23 DIAGNOSIS — M5136 Other intervertebral disc degeneration, lumbar region: Secondary | ICD-10-CM

## 2017-04-23 DIAGNOSIS — M48061 Spinal stenosis, lumbar region without neurogenic claudication: Secondary | ICD-10-CM | POA: Diagnosis not present

## 2017-05-13 DIAGNOSIS — M5136 Other intervertebral disc degeneration, lumbar region: Secondary | ICD-10-CM | POA: Diagnosis not present

## 2017-05-13 DIAGNOSIS — M545 Low back pain: Secondary | ICD-10-CM | POA: Diagnosis not present

## 2017-06-01 DIAGNOSIS — M47816 Spondylosis without myelopathy or radiculopathy, lumbar region: Secondary | ICD-10-CM | POA: Diagnosis not present

## 2017-06-01 DIAGNOSIS — M545 Low back pain: Secondary | ICD-10-CM | POA: Diagnosis not present

## 2017-06-01 DIAGNOSIS — M5136 Other intervertebral disc degeneration, lumbar region: Secondary | ICD-10-CM | POA: Diagnosis not present

## 2017-07-07 DIAGNOSIS — M47816 Spondylosis without myelopathy or radiculopathy, lumbar region: Secondary | ICD-10-CM | POA: Diagnosis not present

## 2017-08-03 DIAGNOSIS — M5136 Other intervertebral disc degeneration, lumbar region: Secondary | ICD-10-CM | POA: Diagnosis not present

## 2017-08-21 DIAGNOSIS — M546 Pain in thoracic spine: Secondary | ICD-10-CM | POA: Diagnosis not present

## 2017-08-21 DIAGNOSIS — M545 Low back pain: Secondary | ICD-10-CM | POA: Diagnosis not present

## 2017-08-21 DIAGNOSIS — M47816 Spondylosis without myelopathy or radiculopathy, lumbar region: Secondary | ICD-10-CM | POA: Diagnosis not present

## 2017-08-31 DIAGNOSIS — M545 Low back pain: Secondary | ICD-10-CM | POA: Diagnosis not present

## 2017-08-31 DIAGNOSIS — M6281 Muscle weakness (generalized): Secondary | ICD-10-CM | POA: Diagnosis not present

## 2017-08-31 DIAGNOSIS — M546 Pain in thoracic spine: Secondary | ICD-10-CM | POA: Diagnosis not present

## 2017-08-31 DIAGNOSIS — G894 Chronic pain syndrome: Secondary | ICD-10-CM | POA: Diagnosis not present

## 2017-09-04 DIAGNOSIS — M546 Pain in thoracic spine: Secondary | ICD-10-CM | POA: Diagnosis not present

## 2017-09-04 DIAGNOSIS — G894 Chronic pain syndrome: Secondary | ICD-10-CM | POA: Diagnosis not present

## 2017-09-04 DIAGNOSIS — M6281 Muscle weakness (generalized): Secondary | ICD-10-CM | POA: Diagnosis not present

## 2017-09-04 DIAGNOSIS — M545 Low back pain: Secondary | ICD-10-CM | POA: Diagnosis not present

## 2017-09-07 DIAGNOSIS — G894 Chronic pain syndrome: Secondary | ICD-10-CM | POA: Diagnosis not present

## 2017-09-07 DIAGNOSIS — M6281 Muscle weakness (generalized): Secondary | ICD-10-CM | POA: Diagnosis not present

## 2017-09-07 DIAGNOSIS — M546 Pain in thoracic spine: Secondary | ICD-10-CM | POA: Diagnosis not present

## 2017-09-07 DIAGNOSIS — M545 Low back pain: Secondary | ICD-10-CM | POA: Diagnosis not present

## 2017-09-15 DIAGNOSIS — M546 Pain in thoracic spine: Secondary | ICD-10-CM | POA: Diagnosis not present

## 2017-09-15 DIAGNOSIS — G894 Chronic pain syndrome: Secondary | ICD-10-CM | POA: Diagnosis not present

## 2017-09-15 DIAGNOSIS — M6281 Muscle weakness (generalized): Secondary | ICD-10-CM | POA: Diagnosis not present

## 2017-09-15 DIAGNOSIS — M545 Low back pain: Secondary | ICD-10-CM | POA: Diagnosis not present

## 2017-10-06 DIAGNOSIS — G894 Chronic pain syndrome: Secondary | ICD-10-CM | POA: Diagnosis not present

## 2017-10-06 DIAGNOSIS — M6281 Muscle weakness (generalized): Secondary | ICD-10-CM | POA: Diagnosis not present

## 2017-10-06 DIAGNOSIS — M546 Pain in thoracic spine: Secondary | ICD-10-CM | POA: Diagnosis not present

## 2017-10-06 DIAGNOSIS — M545 Low back pain: Secondary | ICD-10-CM | POA: Diagnosis not present

## 2017-10-27 DIAGNOSIS — M545 Low back pain: Secondary | ICD-10-CM | POA: Diagnosis not present

## 2017-10-27 DIAGNOSIS — M6281 Muscle weakness (generalized): Secondary | ICD-10-CM | POA: Diagnosis not present

## 2017-10-27 DIAGNOSIS — M546 Pain in thoracic spine: Secondary | ICD-10-CM | POA: Diagnosis not present

## 2017-10-27 DIAGNOSIS — G894 Chronic pain syndrome: Secondary | ICD-10-CM | POA: Diagnosis not present

## 2017-10-28 DIAGNOSIS — Z6826 Body mass index (BMI) 26.0-26.9, adult: Secondary | ICD-10-CM | POA: Diagnosis not present

## 2017-10-28 DIAGNOSIS — Z01419 Encounter for gynecological examination (general) (routine) without abnormal findings: Secondary | ICD-10-CM | POA: Diagnosis not present

## 2017-11-17 DIAGNOSIS — R5383 Other fatigue: Secondary | ICD-10-CM | POA: Diagnosis not present

## 2017-11-17 DIAGNOSIS — Z13228 Encounter for screening for other metabolic disorders: Secondary | ICD-10-CM | POA: Diagnosis not present

## 2017-11-17 DIAGNOSIS — N924 Excessive bleeding in the premenopausal period: Secondary | ICD-10-CM | POA: Diagnosis not present

## 2017-11-17 DIAGNOSIS — Z1329 Encounter for screening for other suspected endocrine disorder: Secondary | ICD-10-CM | POA: Diagnosis not present

## 2017-11-17 DIAGNOSIS — Z13220 Encounter for screening for lipoid disorders: Secondary | ICD-10-CM | POA: Diagnosis not present

## 2017-11-26 DIAGNOSIS — M6281 Muscle weakness (generalized): Secondary | ICD-10-CM | POA: Diagnosis not present

## 2017-11-26 DIAGNOSIS — M545 Low back pain: Secondary | ICD-10-CM | POA: Diagnosis not present

## 2017-11-26 DIAGNOSIS — G894 Chronic pain syndrome: Secondary | ICD-10-CM | POA: Diagnosis not present

## 2017-11-26 DIAGNOSIS — M546 Pain in thoracic spine: Secondary | ICD-10-CM | POA: Diagnosis not present

## 2018-01-07 DIAGNOSIS — N920 Excessive and frequent menstruation with regular cycle: Secondary | ICD-10-CM | POA: Diagnosis not present

## 2018-03-08 DIAGNOSIS — H66002 Acute suppurative otitis media without spontaneous rupture of ear drum, left ear: Secondary | ICD-10-CM | POA: Diagnosis not present

## 2018-03-26 DIAGNOSIS — M5106 Intervertebral disc disorders with myelopathy, lumbar region: Secondary | ICD-10-CM | POA: Diagnosis not present

## 2018-03-26 DIAGNOSIS — M5137 Other intervertebral disc degeneration, lumbosacral region: Secondary | ICD-10-CM | POA: Diagnosis not present

## 2018-03-26 DIAGNOSIS — M545 Low back pain: Secondary | ICD-10-CM | POA: Diagnosis not present

## 2018-03-26 DIAGNOSIS — M4716 Other spondylosis with myelopathy, lumbar region: Secondary | ICD-10-CM | POA: Diagnosis not present

## 2018-04-06 DIAGNOSIS — M5137 Other intervertebral disc degeneration, lumbosacral region: Secondary | ICD-10-CM | POA: Diagnosis not present

## 2018-04-22 DIAGNOSIS — M545 Low back pain: Secondary | ICD-10-CM | POA: Diagnosis not present

## 2018-04-22 DIAGNOSIS — M546 Pain in thoracic spine: Secondary | ICD-10-CM | POA: Diagnosis not present

## 2018-04-22 DIAGNOSIS — M4716 Other spondylosis with myelopathy, lumbar region: Secondary | ICD-10-CM | POA: Diagnosis not present

## 2018-04-30 ENCOUNTER — Other Ambulatory Visit: Payer: Self-pay | Admitting: Orthopaedic Surgery

## 2018-04-30 DIAGNOSIS — M546 Pain in thoracic spine: Secondary | ICD-10-CM

## 2018-05-01 ENCOUNTER — Ambulatory Visit
Admission: RE | Admit: 2018-05-01 | Discharge: 2018-05-01 | Disposition: A | Discharge status: Home / Self Care | Payer: BLUE CROSS/BLUE SHIELD | Source: Ambulatory Visit | Attending: Orthopaedic Surgery | Admitting: Orthopaedic Surgery

## 2018-05-01 DIAGNOSIS — M546 Pain in thoracic spine: Secondary | ICD-10-CM

## 2018-05-20 DIAGNOSIS — Z6827 Body mass index (BMI) 27.0-27.9, adult: Secondary | ICD-10-CM | POA: Diagnosis not present

## 2018-05-20 DIAGNOSIS — M47816 Spondylosis without myelopathy or radiculopathy, lumbar region: Secondary | ICD-10-CM | POA: Diagnosis not present

## 2018-05-20 DIAGNOSIS — M545 Low back pain: Secondary | ICD-10-CM | POA: Diagnosis not present

## 2018-07-15 ENCOUNTER — Ambulatory Visit (INDEPENDENT_AMBULATORY_CARE_PROVIDER_SITE_OTHER): Payer: 59

## 2018-07-15 DIAGNOSIS — Z23 Encounter for immunization: Secondary | ICD-10-CM

## 2018-07-15 NOTE — Progress Notes (Signed)
Per orders of Dr. Caryl Never, injection of Influenza Vaccine given by Jobe Gibbon. Patient tolerated injection well.

## 2018-08-10 ENCOUNTER — Other Ambulatory Visit: Payer: Self-pay | Admitting: Rehabilitation

## 2018-08-10 DIAGNOSIS — M5137 Other intervertebral disc degeneration, lumbosacral region: Secondary | ICD-10-CM

## 2018-08-20 ENCOUNTER — Ambulatory Visit
Admission: RE | Admit: 2018-08-20 | Discharge: 2018-08-20 | Disposition: A | Discharge status: Home / Self Care | Payer: 59 | Source: Ambulatory Visit | Attending: Rehabilitation | Admitting: Rehabilitation

## 2018-08-20 DIAGNOSIS — M5137 Other intervertebral disc degeneration, lumbosacral region: Secondary | ICD-10-CM

## 2018-09-07 ENCOUNTER — Ambulatory Visit: Payer: 59 | Admitting: Cardiology

## 2018-12-20 ENCOUNTER — Telehealth: Payer: Self-pay | Admitting: Cardiology

## 2018-12-20 NOTE — Telephone Encounter (Signed)

## 2018-12-21 ENCOUNTER — Ambulatory Visit: Payer: 59 | Admitting: Cardiology

## 2018-12-23 ENCOUNTER — Ambulatory Visit: Payer: 59 | Admitting: Cardiology

## 2019-01-07 ENCOUNTER — Telehealth: Payer: Self-pay | Admitting: *Deleted

## 2019-01-07 NOTE — Telephone Encounter (Signed)
Called all 3 numbers, not able to reach patient to go over Covid Screening.

## 2019-01-10 ENCOUNTER — Ambulatory Visit (INDEPENDENT_AMBULATORY_CARE_PROVIDER_SITE_OTHER): Payer: 59 | Admitting: Cardiology

## 2019-01-10 ENCOUNTER — Encounter: Payer: Self-pay | Admitting: Cardiology

## 2019-01-10 ENCOUNTER — Other Ambulatory Visit: Payer: Self-pay

## 2019-01-10 VITALS — BP 110/66 | HR 78 | Ht 62.0 in | Wt 145.8 lb

## 2019-01-10 DIAGNOSIS — Z01812 Encounter for preprocedural laboratory examination: Secondary | ICD-10-CM | POA: Diagnosis not present

## 2019-01-10 DIAGNOSIS — I471 Supraventricular tachycardia: Secondary | ICD-10-CM

## 2019-01-10 DIAGNOSIS — R0789 Other chest pain: Secondary | ICD-10-CM | POA: Diagnosis not present

## 2019-01-10 DIAGNOSIS — R079 Chest pain, unspecified: Secondary | ICD-10-CM

## 2019-01-10 NOTE — Progress Notes (Signed)
Electrophysiology Office Note   Date:  01/10/2019   ID:  Erika, [name] [birth year], MRN [id number]  PCP:  Eulas Post, MD  Cardiologist:  Nahser Primary Electrophysiologist:  Jasraj Lappe [name], MD    No chief complaint on file.    History of Present Illness: [name] is a 43 y.o. female who presents today for electrophysiology evaluation.   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 She started to feel better after the ablation.   Today, denies symptoms of palpitations,  shortness of breath, orthopnea, PND, lower extremity edema, claudication, dizziness, presyncope, syncope, bleeding, or neurologic sequela. The patient is tolerating medications without difficulties.  She overall feels well today.  A few months ago, she had episodes of chest pain.  The chest pain was not exacerbated or alleviated by much, though she was not tender to touch.  Pain was not consistent with pericarditis.  She does have a strong family history of young MIs in her aunt.  She is quite nervous about her chest discomfort.  Past Medical History:  Diagnosis Date  . DIZZINESS 03/07/2010  . NEPHROTIC SYNDROME 04/19/2009   Past Surgical History:  Procedure Laterality Date  . ELECTROPHYSIOLOGIC STUDY N/A 04/07/2016   Procedure: SVT Ablation;  Surgeon: Lavante Toso [name], MD;  Location: [place] [facility];  Service: Cardiovascular;  Laterality: N/A;  . None    . WISDOM TOOTH EXTRACTION       Current Outpatient Medications  Medication Sig Dispense Refill  . Multiple Vitamin (MULTIVITAMIN) tablet Take 1 tablet by mouth daily.     No current facility-administered medications for this visit.     Allergies:   Patient has no known allergies.   Social History:  The patient  reports that she has never smoked. She has never used smokeless tobacco. She reports that she does not drink alcohol or use drugs.   Family History:  The patient's family  history includes Diabetes in her father.    ROS:  Please see the history of present illness.   Otherwise, review of systems is positive for none.   All other systems are reviewed and negative.   PHYSICAL EXAM: VS:  BP 110/66   Pulse 78   Ht 5\' 2"  (1.575 m)   Wt 145 lb 12.8 oz (66.1 kg)   SpO2 99%   BMI 26.67 kg/m  , BMI Body mass index is 26.67 kg/m. GEN: Well nourished, well developed, in no acute distress  HEENT: normal  Neck: no JVD, carotid bruits, or masses Cardiac: RRR; no murmurs, rubs, or gallops,no edema  Respiratory:  clear to auscultation bilaterally, normal work of breathing GI: soft, nontender, nondistended, + BS MS: no deformity or atrophy  Skin: warm and dry Neuro:  Strength and sensation are intact Psych: euthymic mood, full affect  EKG:  EKG is ordered today. Personal review of the ekg ordered shows SR, rate 78, PR 110   Recent Labs: No results found for requested labs within last 8760 hours.    Lipid Panel  No results found for: CHOL, TRIG, HDL, CHOLHDL, VLDL, LDLCALC, LDLDIRECT   Wt Readings from Last 3 Encounters:  01/10/19 145 lb 12.8 oz (66.1 kg)  03/19/17 148 lb 8 oz (67.4 kg)  02/18/17 145 lb 6.4 oz (66 kg)      Other studies Reviewed: Additional studies/ records that were reviewed today include:TTE 07/23/16 Review of the above records today demonstrates:  - Left ventricle: The  cavity size was normal. Wall thickness was   normal. Systolic function was normal. The estimated ejection   fraction was in the range of 55% to 60%. Wall motion was normal;   there were no regional wall motion abnormalities. Left   ventricular diastolic function parameters were normal. - Left atrium: The atrium was normal in size. - Inferior vena cava: The vessel was normal in size. The   respirophasic diameter changes were in the normal range (>= 50%),   consistent with normal central venous pressure.    ASSESSMENT AND PLAN:  1.  AVNRT: Ablation 04/07/2016.   Continues to have 1 to 2 seconds of palpitations, though she understands that this is likely not due to AVNRT.  No changes.    2.  Chest pain: Does have a strong family history of young coronary artery disease.  We Loren Sawaya order a coronary CT to further determine if this is cardiac in nature.  Current medicines are reviewed at length with the patient today.   The patient does not have concerns regarding her medicines.  The following changes were made today: None  Labs/ tests ordered today include:  Orders Placed This Encounter  Procedures  . CT CORONARY MORPH W/CTA COR W/SCORE W/CA W/CM &/OR WO/CM  . CT CORONARY FRACTIONAL FLOW RESERVE DATA PREP  . CT CORONARY FRACTIONAL FLOW RESERVE FLUID ANALYSIS  . Basic metabolic panel  . EKG 12-Lead     Disposition:   FU with Tagan Bartram pending CT  months  Signed, Antwanette Wesche Jorja LoaMartin Channel Papandrea, MD  01/10/2019 2:08 PM     [facility] [address] [place] [address] [phone] (office) [phone] (fax)

## 2019-01-10 NOTE — Patient Instructions (Addendum)
Medication Instructions:  Your physician recommends that you continue on your current medications as directed. Please refer to the Current Medication list given to you today.  * If you need a refill on your cardiac medications before your next appointment, please call your pharmacy.   Labwork: Today: BMET *We will only notify you of abnormal results, otherwise continue current treatment plan.  Testing/Procedures: Your physician has requested that you have cardiac CT. Cardiac computed tomography (CT) is a painless test that uses an x-ray machine to take clear, detailed pictures of your heart. For further information please visit [email or web address]. Please follow instruction sheet as given.  Follow-Up: To be determined after CT test  Thank you for choosing [facility]!!   Trinidad Curet, RN [phone]

## 2019-01-11 LAB — BASIC METABOLIC PANEL
BUN/Creatinine Ratio: 17 (ref 9–23)
BUN: 12 mg/dL (ref 6–24)
CO2: 23 mmol/L (ref 20–29)
Calcium: 9.2 mg/dL (ref 8.7–10.2)
Chloride: 103 mmol/L (ref 96–106)
Creatinine, Ser: 0.69 mg/dL (ref 0.57–1.00)
GFR calc Af Amer: 126 mL/min/{1.73_m2} (ref >59)
GFR calc non Af Amer: 109 mL/min/{1.73_m2} (ref >59)
Glucose: 94 mg/dL (ref 65–99)
Potassium: 4.4 mmol/L (ref 3.5–5.2)
Sodium: 139 mmol/L (ref 134–144)

## 2019-01-18 ENCOUNTER — Telehealth (HOSPITAL_COMMUNITY): Payer: Self-pay | Admitting: Emergency Medicine

## 2019-01-18 ENCOUNTER — Other Ambulatory Visit (HOSPITAL_COMMUNITY): Payer: 59

## 2019-01-18 NOTE — Telephone Encounter (Signed)
Reaching out to patient to offer assistance regarding upcoming cardiac imaging study; pt verbalizes understanding of appt date/time, parking situation and where to check in, pre-test NPO status and medications ordered, and verified current allergies; name and call back number provided for further questions should they arise Lisaann Atha RN Navigator Cardiac Imaging [facility] [phone] office [phone] cell  Pt denies covid symptoms, verbalized understanding of visitor policy. 

## 2019-01-19 ENCOUNTER — Other Ambulatory Visit: Payer: Self-pay

## 2019-01-19 ENCOUNTER — Ambulatory Visit
Admission: RE | Admit: 2019-01-19 | Discharge: 2019-01-19 | Disposition: A | Discharge status: Home / Self Care | Payer: 59 | Source: Ambulatory Visit | Attending: Cardiology | Admitting: Cardiology

## 2019-01-19 DIAGNOSIS — R0789 Other chest pain: Secondary | ICD-10-CM

## 2019-01-19 DIAGNOSIS — R079 Chest pain, unspecified: Secondary | ICD-10-CM

## 2019-01-19 MED ORDER — METOPROLOL TARTRATE 5 MG/5ML IV SOLN
5.0000 mg | INTRAVENOUS | Status: DC | PRN
  Administered 2019-01-19 (×2): 5 mg via INTRAVENOUS

## 2019-01-19 MED ORDER — NITROGLYCERIN 0.4 MG SL SUBL
0.8000 mg | SUBLINGUAL_TABLET | Freq: Once | SUBLINGUAL | Status: AC
  Administered 2019-01-19: 0.8 mg via SUBLINGUAL

## 2019-01-19 MED ORDER — IOHEXOL 350 MG/ML SOLN
100.0000 mL | Freq: Once | INTRAVENOUS | Status: AC | PRN
  Administered 2019-01-19: 100 mL via INTRAVENOUS

## 2019-01-19 NOTE — Progress Notes (Signed)
After nitro administration patient had a headache 7/10 throbbing and after drinking water and eating crackers with peanut butter headache is 0.5/10 dull states feels much better. Ambulatory to bathroom to change and to exit steady gait.

## 2019-10-18 LAB — HEPATIC FUNCTION PANEL
ALT: 22 (ref 7–35)
AST: 42 — AB (ref 13–35)
Bilirubin, Total: 0.3

## 2019-10-18 LAB — LIPID PANEL
Cholesterol: 149 (ref 0–200)
HDL: 61 (ref 35–70)
LDL Cholesterol: 11
Triglycerides: 53 (ref 40–160)

## 2019-10-18 LAB — BASIC METABOLIC PANEL
BUN: 14 (ref 4–21)
Chloride: 102 (ref 99–108)
Creatinine: 0.7 (ref 0.5–1.1)
Glucose: 95
Potassium: 4.5 (ref 3.4–5.3)
Sodium: 139 (ref 137–147)

## 2019-10-18 LAB — VITAMIN D 25 HYDROXY (VIT D DEFICIENCY, FRACTURES): Vit D, 25-Hydroxy: 31.2

## 2019-10-18 LAB — TSH: TSH: 2.3 (ref 0.41–5.90)

## 2019-11-09 ENCOUNTER — Encounter: Payer: Self-pay | Admitting: Family Medicine

## 2021-07-11 ENCOUNTER — Encounter: Payer: Self-pay | Admitting: Cardiology

## 2021-07-11 ENCOUNTER — Ambulatory Visit (INDEPENDENT_AMBULATORY_CARE_PROVIDER_SITE_OTHER): Payer: No Typology Code available for payment source | Admitting: Cardiology

## 2021-07-11 ENCOUNTER — Other Ambulatory Visit: Payer: Self-pay

## 2021-07-11 VITALS — BP 110/78 | HR 68 | Ht 62.0 in | Wt 148.4 lb

## 2021-07-11 DIAGNOSIS — I471 Supraventricular tachycardia: Secondary | ICD-10-CM

## 2021-07-11 NOTE — Progress Notes (Signed)
Electrophysiology Office Note   Date:  07/11/2021   ID:  [name], [name] January 10, 1976, MRN [id number]  PCP:  Eulas Post, MD  Cardiologist:  Nahser Primary Electrophysiologist:  Josemanuel Eakins [name], MD    No chief complaint on file.    History of Present Illness: [name] is a 46 y.o. female who presents today for electrophysiology evaluation.     She has a history significant for nephrotic syndrome.  She had episodes of SVT.  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  She did begin to complain of chest discomfort, but had a coronary CT that showed no evidence of coronary artery disease.  Today, denies symptoms of palpitations, chest pain, shortness of breath, orthopnea, PND, lower extremity edema, claudication, dizziness, presyncope, syncope, bleeding, or neurologic sequela. The patient is tolerating medications without difficulties.  He developed recurrent palpitations.  She has palpitations a few times a week.  They have lasted as long as 5 minutes.  She says that they occur both at rest and with exercise.  She does exercise quite vigorously, but does state that her heart rate at times gets above 200 bpm associated with palpitations.  Past Medical History:  Diagnosis Date   DIZZINESS 03/07/2010   NEPHROTIC SYNDROME 04/19/2009   Past Surgical History:  Procedure Laterality Date   ELECTROPHYSIOLOGIC STUDY N/A 04/07/2016   Procedure: SVT Ablation;  Surgeon: Kolina Kube [name], MD;  Location: [place] [facility];  Service: Cardiovascular;  Laterality: N/A;   None     WISDOM TOOTH EXTRACTION       Current Outpatient Medications  Medication Sig Dispense Refill   Multiple Vitamin (MULTIVITAMIN) tablet Take 1 tablet by mouth daily.     No current facility-administered medications for this visit.    Allergies:   Patient has no known allergies.   Social History:  The patient  reports that she has never smoked. She has never used smokeless tobacco.  She reports that she does not drink alcohol and does not use drugs.   Family History:  The patient's family history includes Cancer in her father; Coronary artery disease in her father; Diabetes in her father; Hyperlipidemia in her father and mother; Hypertension in her father and mother; Other in her brother.    ROS:  Please see the history of present illness.   Otherwise, review of systems is positive for none.   All other systems are reviewed and negative.   PHYSICAL EXAM: VS:  BP 110/78    Pulse 68    Ht 5\' 2"  (1.575 m)    Wt 148 lb 6.4 oz (67.3 kg)    SpO2 97%    BMI 27.14 kg/m  , BMI Body mass index is 27.14 kg/m. GEN: Well nourished, well developed, in no acute distress  HEENT: normal  Neck: no JVD, carotid bruits, or masses Cardiac: RRR; no murmurs, rubs, or gallops,no edema  Respiratory:  clear to auscultation bilaterally, normal work of breathing GI: soft, nontender, nondistended, + BS MS: no deformity or atrophy  Skin: warm and dry Neuro:  Strength and sensation are intact Psych: euthymic mood, full affect  EKG:  EKG is ordered today. Personal review of the ekg ordered shows sinus rhythm  Recent Labs: No results found for requested labs within last 8760 hours.    Lipid Panel     Component Value Date/Time   CHOL 149 10/18/2019 0000   TRIG 53 10/18/2019 0000   HDL 61 10/18/2019 0000  [place] 11 10/18/2019 0000     Wt Readings from Last 3 Encounters:  07/11/21 148 lb 6.4 oz (67.3 kg)  01/10/19 145 lb 12.8 oz (66.1 kg)  03/19/17 148 lb 8 oz (67.4 kg)      Other studies Reviewed: Additional studies/ records that were reviewed today include:TTE 07/23/16 Review of the above records today demonstrates:  - Left ventricle: The cavity size was normal. Wall thickness was   normal. Systolic function was normal. The estimated ejection   fraction was in the range of 55% to 60%. Wall motion was normal;   there were no regional wall motion abnormalities. Left    ventricular diastolic function parameters were normal. - Left atrium: The atrium was normal in size. - Inferior vena cava: The vessel was normal in size. The   respirophasic diameter changes were in the normal range (>= 50%),   consistent with normal central venous pressure.      ASSESSMENT AND PLAN:  1.  AVNRT: Status post ablation 04/07/2016.  She is unfortunately had more frequent episodes of palpitations.  It is unclear as to the cause of these palpitations.  Due to that, she Aikam Hellickson get a cardia mobile and send recordings.  We Samuele Storey base her further therapy on that.  Current medicines are reviewed at length with the patient today.   The patient does not have concerns regarding her medicines.  The following changes were made today: None  Labs/ tests ordered today include:  Orders Placed This Encounter  Procedures   EKG 12-Lead     Disposition:   FU with Ezekiel Menzer pending monitor months  Signed, Kissy Cielo [name], MD  07/11/2021 11:11 AM     [facility] [address] [place] [address] [phone] (office) [phone] (fax)

## 2024-01-01 ENCOUNTER — Encounter: Payer: Self-pay | Admitting: Advanced Practice Midwife
